# Patient Record
Sex: Male | Born: 1965 | Race: Black or African American | Hispanic: No | Marital: Married | Smoking: Never smoker
Health system: Southern US, Community
[De-identification: ages and names within clinical notes are randomized; demographics above are authoritative.]

## PROBLEM LIST (undated history)

## (undated) DIAGNOSIS — K4090 Unilateral inguinal hernia, without obstruction or gangrene, not specified as recurrent: Secondary | ICD-10-CM

## (undated) DIAGNOSIS — K4091 Unilateral inguinal hernia, without obstruction or gangrene, recurrent: Secondary | ICD-10-CM

## (undated) DIAGNOSIS — R7303 Prediabetes: Secondary | ICD-10-CM

## (undated) DIAGNOSIS — G4730 Sleep apnea, unspecified: Secondary | ICD-10-CM

## (undated) DIAGNOSIS — E782 Mixed hyperlipidemia: Secondary | ICD-10-CM

## (undated) HISTORY — PX: KNEE SURGERY: SHX244

---

## 2012-11-12 ENCOUNTER — Other Ambulatory Visit (HOSPITAL_COMMUNITY): Payer: Self-pay | Admitting: Orthopaedic Surgery

## 2012-11-12 DIAGNOSIS — R52 Pain, unspecified: Secondary | ICD-10-CM

## 2012-11-12 DIAGNOSIS — M2392 Unspecified internal derangement of left knee: Secondary | ICD-10-CM

## 2012-11-13 ENCOUNTER — Ambulatory Visit (HOSPITAL_COMMUNITY): Payer: BC Managed Care – PPO

## 2012-11-19 ENCOUNTER — Ambulatory Visit (HOSPITAL_COMMUNITY)
Admission: RE | Admit: 2012-11-19 | Discharge: 2012-11-19 | Disposition: A | Discharge status: Home / Self Care | Payer: BC Managed Care – PPO | Source: Ambulatory Visit | Attending: Orthopaedic Surgery | Admitting: Orthopaedic Surgery

## 2012-11-19 DIAGNOSIS — M2392 Unspecified internal derangement of left knee: Secondary | ICD-10-CM

## 2012-11-19 DIAGNOSIS — R52 Pain, unspecified: Secondary | ICD-10-CM

## 2012-11-19 DIAGNOSIS — M25569 Pain in unspecified knee: Secondary | ICD-10-CM | POA: Insufficient documentation

## 2012-11-19 DIAGNOSIS — M25469 Effusion, unspecified knee: Secondary | ICD-10-CM | POA: Insufficient documentation

## 2012-11-19 DIAGNOSIS — R937 Abnormal findings on diagnostic imaging of other parts of musculoskeletal system: Secondary | ICD-10-CM | POA: Insufficient documentation

## 2014-11-20 ENCOUNTER — Other Ambulatory Visit: Payer: Self-pay | Admitting: Orthopedic Surgery

## 2014-11-20 DIAGNOSIS — M25562 Pain in left knee: Secondary | ICD-10-CM

## 2014-11-21 ENCOUNTER — Other Ambulatory Visit (HOSPITAL_COMMUNITY): Payer: Self-pay | Admitting: Orthopedic Surgery

## 2014-11-21 DIAGNOSIS — M25562 Pain in left knee: Secondary | ICD-10-CM

## 2014-12-02 ENCOUNTER — Other Ambulatory Visit (HOSPITAL_COMMUNITY): Payer: Self-pay | Admitting: Orthopedic Surgery

## 2014-12-02 ENCOUNTER — Other Ambulatory Visit: Payer: Self-pay | Admitting: Orthopedic Surgery

## 2014-12-02 DIAGNOSIS — M25562 Pain in left knee: Secondary | ICD-10-CM

## 2014-12-03 ENCOUNTER — Ambulatory Visit
Admission: RE | Admit: 2014-12-03 | Discharge: 2014-12-03 | Disposition: A | Discharge status: Home / Self Care | Payer: BLUE CROSS/BLUE SHIELD | Source: Ambulatory Visit | Attending: Orthopedic Surgery | Admitting: Orthopedic Surgery

## 2014-12-03 ENCOUNTER — Other Ambulatory Visit: Payer: Self-pay

## 2014-12-03 DIAGNOSIS — M25562 Pain in left knee: Secondary | ICD-10-CM

## 2014-12-05 ENCOUNTER — Ambulatory Visit (HOSPITAL_COMMUNITY): Payer: Self-pay

## 2016-05-25 ENCOUNTER — Other Ambulatory Visit (INDEPENDENT_AMBULATORY_CARE_PROVIDER_SITE_OTHER): Payer: Self-pay | Admitting: Orthopedic Surgery

## 2016-05-25 NOTE — Telephone Encounter (Signed)
Rx refill

## 2016-06-02 ENCOUNTER — Ambulatory Visit (INDEPENDENT_AMBULATORY_CARE_PROVIDER_SITE_OTHER): Payer: BLUE CROSS/BLUE SHIELD | Admitting: Orthopedic Surgery

## 2016-06-02 ENCOUNTER — Encounter (INDEPENDENT_AMBULATORY_CARE_PROVIDER_SITE_OTHER): Payer: Self-pay | Admitting: Orthopedic Surgery

## 2016-06-02 DIAGNOSIS — M1712 Unilateral primary osteoarthritis, left knee: Secondary | ICD-10-CM | POA: Diagnosis not present

## 2016-06-02 MED ORDER — OXYCODONE HCL 5 MG PO CAPS: 5.0000 mg | ORAL_CAPSULE | Freq: Two times a day (BID) | ORAL | 0 refills | Status: DC

## 2016-06-02 MED ORDER — NABUMETONE 500 MG PO TABS
500.0000 mg | ORAL_TABLET | Freq: Two times a day (BID) | ORAL | 0 refills | Status: DC
Start: 2016-06-02 — End: 2018-01-11

## 2016-06-02 NOTE — Progress Notes (Signed)
   Procedure Note  Patient: Daniel Caldwell             Date of Birth: May 02, 1966           MRN: [id number]             Visit Date: 06/02/2016  Procedures: Visit Diagnoses: Primary osteoarthritis of left knee  Large Joint Inj Date/Time: 06/02/2016 4:37 PM Performed by: Brand Males Authorized by: Meredith Pel   Consent Given by:  Patient Site marked: the procedure site was marked   Timeout: prior to procedure the correct patient, procedure, and site was verified   Indications:  Pain, joint swelling and diagnostic evaluation Location:  Knee Site:  L knee Prep: patient was prepped and draped in usual sterile fashion   Needle Size:  18 G Needle Length:  1.5 inches Approach:  Superolateral Ultrasound Guidance: No   Fluoroscopic Guidance: No   Arthrogram: No   Medications:  48 mg Hylan 48 MG/6ML; 5 mL lidocaine 1 % Patient tolerance:  Patient tolerated the procedure well with no immediate complications

## 2016-06-03 MED ORDER — LIDOCAINE HCL 1 % IJ SOLN
5.0000 mL | INTRAMUSCULAR | Status: AC | PRN
  Administered 2016-06-02: 5 mL

## 2016-06-03 MED ORDER — HYLAN G-F 20 48 MG/6ML IX SOSY
48.0000 mg | PREFILLED_SYRINGE | INTRA_ARTICULAR | Status: AC | PRN
  Administered 2016-06-02: 48 mg via INTRA_ARTICULAR

## 2016-06-06 ENCOUNTER — Telehealth (INDEPENDENT_AMBULATORY_CARE_PROVIDER_SITE_OTHER): Payer: Self-pay | Admitting: Orthopedic Surgery

## 2016-06-06 MED ORDER — OXYCODONE HCL 5 MG PO TABS
5.0000 mg | ORAL_TABLET | Freq: Two times a day (BID) | ORAL | 0 refills | Status: DC
Start: 2016-06-06 — End: 2017-02-22

## 2016-06-06 NOTE — Telephone Encounter (Signed)
Patient is requesting a new script for oxycodone says the pharmacy will not fill his rx because it says capsules and not tablets.  Cb#: [phone]

## 2016-06-06 NOTE — Telephone Encounter (Signed)
Dr Marlou Sa s/w pharm and advised that the capsules can be replaced with tablets- pt will take Rx back to pharm to get filled

## 2016-07-05 ENCOUNTER — Other Ambulatory Visit (INDEPENDENT_AMBULATORY_CARE_PROVIDER_SITE_OTHER): Payer: Self-pay | Admitting: Orthopedic Surgery

## 2016-07-05 NOTE — Telephone Encounter (Signed)
Can you advise on refill? Dean patient

## 2016-09-01 ENCOUNTER — Ambulatory Visit (INDEPENDENT_AMBULATORY_CARE_PROVIDER_SITE_OTHER): Payer: BLUE CROSS/BLUE SHIELD | Admitting: Orthopedic Surgery

## 2016-09-01 ENCOUNTER — Encounter (INDEPENDENT_AMBULATORY_CARE_PROVIDER_SITE_OTHER): Payer: Self-pay

## 2016-09-07 ENCOUNTER — Ambulatory Visit (INDEPENDENT_AMBULATORY_CARE_PROVIDER_SITE_OTHER): Payer: BLUE CROSS/BLUE SHIELD | Admitting: Orthopedic Surgery

## 2016-09-07 ENCOUNTER — Encounter (INDEPENDENT_AMBULATORY_CARE_PROVIDER_SITE_OTHER): Payer: Self-pay | Admitting: Orthopedic Surgery

## 2016-09-07 DIAGNOSIS — M5412 Radiculopathy, cervical region: Secondary | ICD-10-CM | POA: Diagnosis not present

## 2016-09-07 DIAGNOSIS — G8929 Other chronic pain: Secondary | ICD-10-CM | POA: Diagnosis not present

## 2016-09-07 DIAGNOSIS — M25562 Pain in left knee: Secondary | ICD-10-CM | POA: Diagnosis not present

## 2016-09-07 MED ORDER — METHOCARBAMOL 500 MG PO TABS: 500.0000 mg | ORAL_TABLET | Freq: Three times a day (TID) | ORAL | 0 refills | Status: DC | PRN

## 2016-09-07 MED ORDER — PREDNISONE 5 MG (21) PO TBPK
ORAL_TABLET | ORAL | 0 refills | Status: DC
Start: 2016-09-07 — End: 2018-01-08

## 2016-09-07 MED ORDER — METHYLPREDNISOLONE ACETATE 40 MG/ML IJ SUSP
40.0000 mg | INTRAMUSCULAR | Status: AC | PRN
  Administered 2016-09-07: 40 mg via INTRA_ARTICULAR

## 2016-09-07 MED ORDER — LIDOCAINE HCL 1 % IJ SOLN
5.0000 mL | INTRAMUSCULAR | Status: AC | PRN
  Administered 2016-09-07: 5 mL

## 2016-09-07 MED ORDER — BUPIVACAINE HCL 0.25 % IJ SOLN
4.0000 mL | INTRAMUSCULAR | Status: AC | PRN
  Administered 2016-09-07: 4 mL via INTRA_ARTICULAR

## 2016-09-07 NOTE — Progress Notes (Signed)
Office Visit Note   Patient: Daniel Caldwell           Date of Birth: 11-Aug-1965           MRN: [id number] Visit Date: 09/07/2016 Requested by: No referring provider defined for this encounter. PCP: No PCP Per Patient  Subjective: Chief Complaint  Patient presents with  . Left Knee - Follow-up    HPI: Daniel Caldwell is a 51 year old patient with left knee pain.  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  Here for 3 month recheck had an injection 06/02/2016 which helped.  He is also reporting having some right neck trapezial and arm pain following a tire lifting incident at work 6 weeks ago.  He is taking anti-inflammatories and occasional pain medicine but only once every week or 2.  The pain does not wake him from sleep at night.              ROS: All systems reviewed are negative as they relate to the chief complaint within the history of present illness.  Patient denies  fevers or chills.   Assessment & Plan: Visit Diagnoses:  1. Chronic pain of left knee     Plan: Impression is left knee arthritis with no effusion today but some persistent pain.  Injection is performed.  We'll check him back in 3 months for recheck.  Potentially more concerning is what appears to be some cervical radiculopathy on the right-hand side.  No weakness but he does report pain radiating into the forearm.  This happened after strenuous lifting event at work.  Shoulder exam is pretty benign today.  Plan is for Medrol Dosepak muscle relaxers with return office visit if that doesn't help.  Could consider further imaging of the neck first followed by the shoulder if that's negative.  Follow-Up Instructions: Return in about 3 months (around 12/08/2016).   Orders:  No orders of the defined types were placed in this encounter.  Meds ordered this encounter  Medications  . methocarbamol (ROBAXIN) 500 MG tablet    Sig: Take 1 tablet (500 mg total) by mouth every 8 (eight) hours as needed for muscle spasms.   Dispense:  30 tablet    Refill:  0  . predniSONE (STERAPRED UNI-PAK 21 TAB) 5 MG (21) TBPK tablet    Sig: Take as directed in a tapered fashion    Dispense:  21 tablet    Refill:  0      Procedures: Large Joint Inj Date/Time: 09/07/2016 7:32 PM Performed by: Daniel Caldwell Authorized by: Daniel Caldwell   Consent Given by:  Patient Site marked: the procedure site was marked   Timeout: prior to procedure the correct patient, procedure, and site was verified   Indications:  Pain, joint swelling and diagnostic evaluation Location:  Knee Site:  L knee Prep: patient was prepped and draped in usual sterile fashion   Needle Size:  18 G Needle Length:  1.5 inches Approach:  Superolateral Ultrasound Guidance: No   Fluoroscopic Guidance: No   Arthrogram: No   Medications:  5 mL lidocaine 1 %; 4 mL bupivacaine 0.25 %; 40 mg methylPREDNISolone acetate 40 MG/ML Patient tolerance:  Patient tolerated the procedure well with no immediate complications     Clinical Data: No additional findings.  Objective: Vital Signs: There were no vitals taken for this visit.  Physical Exam:   Constitutional: Patient appears well-developed HEENT:  Head: Normocephalic Eyes:EOM are normal Neck: Normal range of motion Cardiovascular: Normal  rate Pulmonary/chest: Effort normal Neurologic: Patient is alert Skin: Skin is warm Psychiatric: Patient has normal mood and affect    Ortho Exam: Orthopedic exam demonstrates pretty good cervical spine range of motion.  Full active and passive range of motion of the right shoulders present with no course grinding or crepitus on physical examination good rotator cuff strength isolated infraspinatus super space and subscap muscle testing.  Negative apprehension relocation testing negative O'Brien's testing negative impingement signs no acromioclavicular joint tenderness to direct palpation.  No definite paresthesias C5 T1.  Motor strength otherwise is  intact in the arms bilaterally.  Left knee exam demonstrates lateral joint line tenderness no effusion 5-7 flexion contracture with intact extensor mechanism and palpable pedal pulses  Specialty Comments:  No specialty comments available.  Imaging: No results found.   PMFS History: There are no active problems to display for this patient.  No past medical history on file.  No family history on file.  No past surgical history on file. Social History   Occupational History  . Not on file.   Social History Main Topics  . Smoking status: Never Smoker  . Smokeless tobacco: Current User    Types: Chew  . Alcohol use Yes  . Drug use: No  . Sexual activity: Not on file

## 2016-09-20 ENCOUNTER — Telehealth (INDEPENDENT_AMBULATORY_CARE_PROVIDER_SITE_OTHER): Payer: Self-pay

## 2016-09-20 NOTE — Telephone Encounter (Signed)
Called patient and left message for him to call back to get scheduled for synvisc one injection.  Per BV online, [employer] will not cover but patients secondary insurance [employer] will cover at 100%

## 2016-12-05 ENCOUNTER — Ambulatory Visit (INDEPENDENT_AMBULATORY_CARE_PROVIDER_SITE_OTHER): Payer: Self-pay

## 2016-12-05 ENCOUNTER — Encounter (INDEPENDENT_AMBULATORY_CARE_PROVIDER_SITE_OTHER): Payer: Self-pay | Admitting: Orthopedic Surgery

## 2016-12-05 ENCOUNTER — Ambulatory Visit (INDEPENDENT_AMBULATORY_CARE_PROVIDER_SITE_OTHER): Payer: BLUE CROSS/BLUE SHIELD | Admitting: Orthopedic Surgery

## 2016-12-05 DIAGNOSIS — M25562 Pain in left knee: Principal | ICD-10-CM

## 2016-12-05 DIAGNOSIS — M542 Cervicalgia: Secondary | ICD-10-CM | POA: Diagnosis not present

## 2016-12-05 DIAGNOSIS — M25511 Pain in right shoulder: Secondary | ICD-10-CM | POA: Diagnosis not present

## 2016-12-05 DIAGNOSIS — G8929 Other chronic pain: Secondary | ICD-10-CM

## 2016-12-06 ENCOUNTER — Ambulatory Visit (INDEPENDENT_AMBULATORY_CARE_PROVIDER_SITE_OTHER): Payer: Self-pay

## 2016-12-06 ENCOUNTER — Telehealth (INDEPENDENT_AMBULATORY_CARE_PROVIDER_SITE_OTHER): Payer: Self-pay | Admitting: *Deleted

## 2016-12-06 NOTE — Telephone Encounter (Signed)
Pt has appt scheduled for MRI CSP at [facility] on Mon June 11 at 3:00pm, pt is to arrive 30 mins early to check in, left message on vm to return my call for appt information

## 2016-12-06 NOTE — Telephone Encounter (Signed)
Patient returned called, advised him of message concerning MRI per [place] S.  Patient was advised to call Forestine Na to reschedule for MRI.  Patient was provided with Phone number.

## 2016-12-07 DIAGNOSIS — M25562 Pain in left knee: Secondary | ICD-10-CM | POA: Diagnosis not present

## 2016-12-07 MED ORDER — BUPIVACAINE HCL 0.25 % IJ SOLN
4.0000 mL | INTRAMUSCULAR | Status: AC | PRN
  Administered 2016-12-07: 4 mL via INTRA_ARTICULAR

## 2016-12-07 MED ORDER — METHYLPREDNISOLONE ACETATE 40 MG/ML IJ SUSP
40.0000 mg | INTRAMUSCULAR | Status: AC | PRN
  Administered 2016-12-07: 40 mg via INTRA_ARTICULAR

## 2016-12-07 MED ORDER — LIDOCAINE HCL 1 % IJ SOLN
5.0000 mL | INTRAMUSCULAR | Status: AC | PRN
  Administered 2016-12-07: 5 mL

## 2016-12-07 NOTE — Progress Notes (Signed)
Office Visit Note   Patient: Daniel Caldwell           Date of Birth: [birth year]           MRN: [id number] Visit Date: 12/05/2016 Requested by: No referring provider defined for this encounter. PCP: Patient, No Pcp Per  Subjective: Chief Complaint  Patient presents with  . Left Knee - Follow-up    HPI: Patient is a 51 year old with left knee pain as well as neck pain and right shoulder pain.  He's having predictable left knee pain which is worse with activity.  Has significant osteochondral defect on that side.  Present shot helped him for 2 months.  In the third month shot begins to wear off.  He also describes neck pain radiating into the right arm down to the fingers.  This is debilitating pain which occurs on a daily basis.  He reports taking oxycodone for this pain.              ROS: All systems reviewed are negative as they relate to the chief complaint within the history of present illness.  Patient denies  fevers or chills.   Assessment & Plan: Visit Diagnoses:  1. Chronic pain of left knee   2. Neck pain   3. Chronic right shoulder pain     Plan: Impression is right sided radiculopathy with normal shoulder exam and radiographs of the shoulder.  He is having fairly classic radicular signs and symptoms affecting the right arm.  This is been going on for several months.  Needs MRI cervical spine to evaluate for right-sided radiculopathy.  He has very heavy lifting for a living.  In regards to the knee we'll inject the cortisone shot into the knee again today.  Last injection prior to the more recent cortisone injection was Synvisc in September.  We will pre-approve him for Synvisc again.  I'll see him back after the MRI scan on his neck  Follow-Up Instructions: No Follow-up on file.   Orders:  Orders Placed This Encounter  Procedures  . XR Shoulder Right  . XR Cervical Spine 2 or 3 views  . MR Cervical Spine w/o contrast   No orders of the defined types were placed in  this encounter.     Procedures: Large Joint Inj Date/Time: 12/07/2016 12:44 PM Performed by: Meredith Pel Authorized by: Meredith Pel   Consent Given by:  Patient Site marked: the procedure site was marked   Timeout: prior to procedure the correct patient, procedure, and site was verified   Indications:  Pain, joint swelling and diagnostic evaluation Location:  Knee Site:  L knee Prep: patient was prepped and draped in usual sterile fashion   Needle Size:  18 G Needle Length:  1.5 inches Approach:  Superolateral Ultrasound Guidance: No   Fluoroscopic Guidance: No   Arthrogram: No   Medications:  5 mL lidocaine 1 %; 4 mL bupivacaine 0.25 %; 40 mg methylPREDNISolone acetate 40 MG/ML Patient tolerance:  Patient tolerated the procedure well with no immediate complications     Clinical Data: No additional findings.  Objective: Vital Signs: There were no vitals taken for this visit.  Physical Exam:   Constitutional: Patient appears well-developed HEENT:  Head: Normocephalic Eyes:EOM are normal Neck: Normal range of motion Cardiovascular: Normal rate Pulmonary/chest: Effort normal Neurologic: Patient is alert Skin: Skin is warm Psychiatric: Patient has normal mood and affect    Ortho Exam: Orthopedic exam demonstrates full active and passive range of  motion of both shoulders with no course grinding or crepitus with active or passive range of motion.  Motor sensory function to the arm and hand is intact but he does report some paresthesias in the C7 distribution right versus left.  Cervical spine range of motion is full but does report reproduction of symptoms with rotation of the head to the right.  Left knee has no effusion but is developing a flexion contracture of approximately 8-9.  Pedal pulses palpable.  Specialty Comments:  No specialty comments available.  Imaging: No results found.   PMFS History: There are no active problems to display for  this patient.  No past medical history on file.  No family history on file.  No past surgical history on file. Social History   Occupational History  . Not on file.   Social History Main Topics  . Smoking status: Never Smoker  . Smokeless tobacco: Current User    Types: Chew  . Alcohol use Yes  . Drug use: No  . Sexual activity: Not on file

## 2016-12-08 ENCOUNTER — Telehealth (INDEPENDENT_AMBULATORY_CARE_PROVIDER_SITE_OTHER): Payer: Self-pay

## 2016-12-08 NOTE — Telephone Encounter (Signed)
Patients primary insurance denied coverage for synvisc injection this time. I have called patient and advised and he would like to know if there is anything else that you can suggest since they denied.

## 2016-12-08 NOTE — Telephone Encounter (Signed)
Can we try mono visc or others>?

## 2016-12-09 NOTE — Telephone Encounter (Signed)
Submitted information for monovisc.

## 2016-12-12 ENCOUNTER — Ambulatory Visit (HOSPITAL_COMMUNITY): Payer: BLUE CROSS/BLUE SHIELD

## 2016-12-14 ENCOUNTER — Ambulatory Visit (HOSPITAL_COMMUNITY)
Admission: RE | Admit: 2016-12-14 | Discharge: 2016-12-14 | Disposition: A | Discharge status: Home / Self Care | Payer: BLUE CROSS/BLUE SHIELD | Source: Ambulatory Visit | Attending: Orthopedic Surgery | Admitting: Orthopedic Surgery

## 2016-12-14 DIAGNOSIS — G8929 Other chronic pain: Secondary | ICD-10-CM

## 2016-12-14 DIAGNOSIS — M542 Cervicalgia: Secondary | ICD-10-CM

## 2016-12-14 DIAGNOSIS — M25511 Pain in right shoulder: Secondary | ICD-10-CM

## 2017-02-22 ENCOUNTER — Encounter (INDEPENDENT_AMBULATORY_CARE_PROVIDER_SITE_OTHER): Payer: Self-pay | Admitting: Orthopedic Surgery

## 2017-02-22 ENCOUNTER — Ambulatory Visit (INDEPENDENT_AMBULATORY_CARE_PROVIDER_SITE_OTHER): Payer: BLUE CROSS/BLUE SHIELD | Admitting: Orthopedic Surgery

## 2017-02-22 DIAGNOSIS — M1712 Unilateral primary osteoarthritis, left knee: Secondary | ICD-10-CM

## 2017-02-22 MED ORDER — BUPIVACAINE HCL 0.25 % IJ SOLN
4.0000 mL | INTRAMUSCULAR | Status: AC | PRN
  Administered 2017-02-22: 4 mL via INTRA_ARTICULAR

## 2017-02-22 MED ORDER — METHYLPREDNISOLONE ACETATE 40 MG/ML IJ SUSP
40.0000 mg | INTRAMUSCULAR | Status: AC | PRN
  Administered 2017-02-22: 40 mg via INTRA_ARTICULAR

## 2017-02-22 MED ORDER — OXYCODONE HCL 5 MG PO TABS: 5.0000 mg | ORAL_TABLET | Freq: Two times a day (BID) | ORAL | 0 refills | Status: DC | PRN

## 2017-02-22 MED ORDER — LIDOCAINE HCL 1 % IJ SOLN
5.0000 mL | INTRAMUSCULAR | Status: AC | PRN
  Administered 2017-02-22: 5 mL

## 2017-02-22 NOTE — Progress Notes (Signed)
Office Visit Note   Patient: Daniel Caldwell           Date of Birth: Aug 06, 1965           MRN: [id number] Visit Date: 02/22/2017 Requested by: No referring provider defined for this encounter. PCP: Patient, No Pcp Per  Subjective: Chief Complaint  Patient presents with  . Left Knee - Follow-up   Daniel Caldwell is a 51 year old patient with left knee pain.  Had left knee cortisone injection almost 3 months ago.  Still is about the same but he feels like his knee is gradually getting worse.  His insurance has decided that they will fail to cover any more gel injections.  These were helping him.  His heart for him to get up after prolonged sitting.  He continues to work.  He does do standing type work.  Describes stiffness and some popping in the knee.  He also describes a progressive flexion contracture. HPI: See above              ROS: All systems reviewed are negative as they relate to the chief complaint within the history of present illness.  Patient denies  fevers or chills.   Assessment & Plan: Visit Diagnoses: No diagnosis found.  Plan: Impression is left knee arthritis primarily lateral sided.  He is getting a progressive flexion contracture on the order of 10-15.  I think it's likely putting him out of intention for partial knee replacement.  He is getting into enough symptoms that he may potentially benefit from total knee replacement.  In his case I would likely consider using press-fit prosthesis based on bone quality and increased longevity of the prosthesis.  He is going to consider his options.  I did do an injection today for pain relief.  Also gave him a knee sleeve because his other one was wearing out.  I'll see him back as needed  Follow-Up Instructions: No Follow-up on file.   Orders:  No orders of the defined types were placed in this encounter.  Meds ordered this encounter  Medications  . oxyCODONE (ROXICODONE) 5 MG immediate release tablet    Sig: Take 1 tablet (5 mg  total) by mouth 2 (two) times daily as needed for severe pain.    Dispense:  30 tablet    Refill:  0      Procedures: Large Joint Inj Date/Time: 02/22/2017 4:38 PM Performed by: Meredith Pel Authorized by: Meredith Pel   Consent Given by:  Patient Site marked: the procedure site was marked   Timeout: prior to procedure the correct patient, procedure, and site was verified   Indications:  Pain, joint swelling and diagnostic evaluation Location:  Knee Site:  L knee Prep: patient was prepped and draped in usual sterile fashion   Needle Size:  18 G Needle Length:  1.5 inches Approach:  Superolateral Ultrasound Guidance: No   Fluoroscopic Guidance: No   Arthrogram: No   Medications:  5 mL lidocaine 1 %; 4 mL bupivacaine 0.25 %; 40 mg methylPREDNISolone acetate 40 MG/ML Patient tolerance:  Patient tolerated the procedure well with no immediate complications     Clinical Data: No additional findings.  Objective: Vital Signs: There were no vitals taken for this visit.  Physical Exam:   Constitutional: Patient appears well-developed HEENT:  Head: Normocephalic Eyes:EOM are normal Neck: Normal range of motion Cardiovascular: Normal rate Pulmonary/chest: Effort normal Neurologic: Patient is alert Skin: Skin is warm Psychiatric: Patient has normal mood and  affect   orthopedic exam demonstrates antalgic gait to the left.  No effusion in the left knee.  Extensor mechanism is intact.  Collateral and cruciate ligaments are stable.  Does have palpable pedal pulses.  Does have lateral sided tenderness to palpation with osteophyte formation noted on the distal lateral femur. Ortho Exam:   Specialty Comments:  No specialty comments available.  Imaging: No results found.   PMFS History: There are no active problems to display for this patient.  No past medical history on file.  No family history on file.  No past surgical history on file. Social History    Occupational History  . Not on file.   Social History Main Topics  . Smoking status: Never Smoker  . Smokeless tobacco: Current User    Types: Chew  . Alcohol use Yes  . Drug use: No  . Sexual activity: Not on file

## 2017-03-09 ENCOUNTER — Emergency Department (HOSPITAL_COMMUNITY): Payer: BLUE CROSS/BLUE SHIELD

## 2017-03-09 ENCOUNTER — Encounter (HOSPITAL_COMMUNITY): Payer: Self-pay | Admitting: Emergency Medicine

## 2017-03-09 ENCOUNTER — Emergency Department (HOSPITAL_COMMUNITY)
Admission: EM | Admit: 2017-03-09 | Discharge: 2017-03-09 | Disposition: A | Discharge status: Home / Self Care | Payer: BLUE CROSS/BLUE SHIELD | Attending: Emergency Medicine | Admitting: Emergency Medicine

## 2017-03-09 DIAGNOSIS — K649 Unspecified hemorrhoids: Secondary | ICD-10-CM | POA: Insufficient documentation

## 2017-03-09 DIAGNOSIS — K4090 Unilateral inguinal hernia, without obstruction or gangrene, not specified as recurrent: Secondary | ICD-10-CM | POA: Diagnosis not present

## 2017-03-09 DIAGNOSIS — Z79899 Other long term (current) drug therapy: Secondary | ICD-10-CM | POA: Diagnosis not present

## 2017-03-09 DIAGNOSIS — R1084 Generalized abdominal pain: Secondary | ICD-10-CM | POA: Diagnosis present

## 2017-03-09 DIAGNOSIS — F17220 Nicotine dependence, chewing tobacco, uncomplicated: Secondary | ICD-10-CM | POA: Diagnosis not present

## 2017-03-09 DIAGNOSIS — Z791 Long term (current) use of non-steroidal anti-inflammatories (NSAID): Secondary | ICD-10-CM | POA: Diagnosis not present

## 2017-03-09 LAB — CBC WITH DIFFERENTIAL/PLATELET
BASOS ABS: 0 10*3/uL (ref 0.0–0.1)
BASOS PCT: 1 %
Eosinophils Absolute: 0.2 10*3/uL (ref 0.0–0.7)
Eosinophils Relative: 3 %
HCT: 44.3 % (ref 39.0–52.0)
HEMOGLOBIN: 14.3 g/dL (ref 13.0–17.0)
LYMPHS PCT: 21 %
Lymphs Abs: 1.3 10*3/uL (ref 0.7–4.0)
MCH: 27.8 pg (ref 26.0–34.0)
MCHC: 32.3 g/dL (ref 30.0–36.0)
MCV: 86 fL (ref 78.0–100.0)
MONOS PCT: 12 %
Monocytes Absolute: 0.7 10*3/uL (ref 0.1–1.0)
NEUTROS ABS: 3.8 10*3/uL (ref 1.7–7.7)
NEUTROS PCT: 63 %
Platelets: 215 10*3/uL (ref 150–400)
RBC: 5.15 MIL/uL (ref 4.22–5.81)
RDW: 13.8 % (ref 11.5–15.5)
WBC: 5.9 10*3/uL (ref 4.0–10.5)

## 2017-03-09 LAB — BASIC METABOLIC PANEL
ANION GAP: 8 (ref 5–15)
BUN: 15 mg/dL (ref 6–20)
CALCIUM: 9.4 mg/dL (ref 8.9–10.3)
CHLORIDE: 101 mmol/L (ref 101–111)
CO2: 32 mmol/L (ref 22–32)
Creatinine, Ser: 1.14 mg/dL (ref 0.61–1.24)
GLUCOSE: 100 mg/dL — AB (ref 65–99)
Potassium: 4.2 mmol/L (ref 3.5–5.1)
Sodium: 141 mmol/L (ref 135–145)

## 2017-03-09 NOTE — ED Provider Notes (Signed)
[facility] DEPT Provider Note   CSN: [id number] Arrival date & time: 03/09/17  1806     History   Chief Complaint Chief Complaint  Patient presents with  . Hemorrhoids    HPI Daniel Caldwell is a 51 y.o. male.   Presents for evaluation of right-sided groin hernia.  This was noticed today, when he was being evaluated for hemorrhoids at an urgent care.  After there was concerned about incarceration, so sent him here.  He has been constipated recently, straining to have stools.  Today he noticed some bleeding from his hemorrhoids, so went to get them checked.  He denies fever, chills, nausea, vomiting, weakness or dizziness.  He is known that he has had a recurrent hernia, for several years.  There are no other known modifying factors  HPI  History reviewed. No pertinent past medical history.  There are no active problems to display for this patient.   Past Surgical History:  Procedure Laterality Date  . KNEE SURGERY         Home Medications    Prior to Admission medications   Medication Sig Start Date End Date Taking? Authorizing Provider  meloxicam (MOBIC) 15 MG tablet TAKE 1 TABLET BY MOUTH EVERY DAY WITH FOOD AS DIRECTED 07/05/16   Marybelle Killings, MD  methocarbamol (ROBAXIN) 500 MG tablet Take 1 tablet (500 mg total) by mouth every 8 (eight) hours as needed for muscle spasms. 09/07/16   Meredith Pel, MD  nabumetone (RELAFEN) 500 MG tablet Take 1 tablet (500 mg total) by mouth 2 (two) times daily. 06/02/16   Meredith Pel, MD  oxyCODONE (ROXICODONE) 5 MG immediate release tablet Take 1 tablet (5 mg total) by mouth 2 (two) times daily as needed for severe pain. 02/22/17   Meredith Pel, MD  predniSONE (STERAPRED UNI-PAK 21 TAB) 5 MG (21) TBPK tablet Take as directed in a tapered fashion 09/07/16   Marlou Sa Tonna Corner, MD    Family History History reviewed. No pertinent family history.  Social History Social History  Substance Use Topics  . Smoking  status: Never Smoker  . Smokeless tobacco: Current User    Types: Chew  . Alcohol use Yes     Comment: occ     Allergies   Penicillins   Review of Systems Review of Systems  All other systems reviewed and are negative.    Physical Exam Updated Vital Signs BP 122/72   Pulse 85   Temp 99 F (37.2 C) (Oral)   Resp 15   Ht 5\' 10"  (1.778 m)   Wt 77.1 kg (170 lb)   SpO2 97%   BMI 24.39 kg/m   Physical Exam  Constitutional: He is oriented to person, place, and time. He appears well-developed and well-nourished.  HENT:  Head: Normocephalic and atraumatic.  Right Ear: External ear normal.  Left Ear: External ear normal.  Eyes: Pupils are equal, round, and reactive to light. Conjunctivae and EOM are normal.  Neck: Normal range of motion and phonation normal. Neck supple.  Cardiovascular: Normal rate, regular rhythm and normal heart sounds.   Pulmonary/Chest: Effort normal and breath sounds normal. He exhibits no bony tenderness.  Abdominal: Soft. There is no tenderness.  Bilateral reducible groin hernias without tenderness, or overlying skin changes.  Genitourinary:  Genitourinary Comments: Normal penis, scrotum and scrotal contents  Musculoskeletal: Normal range of motion.  Neurological: He is alert and oriented to person, place, and time. No cranial nerve deficit or sensory deficit. He  exhibits normal muscle tone. Coordination normal.  Skin: Skin is warm, dry and intact.  Psychiatric: He has a normal mood and affect. His behavior is normal. Judgment and thought content normal.  Nursing note and vitals reviewed.    ED Treatments / Results  Labs (all labs ordered are listed, but only abnormal results are displayed) Labs Reviewed  BASIC METABOLIC PANEL - Abnormal; Notable for the following:       Result Value   Glucose, Bld 100 (*)    All other components within normal limits  CBC WITH DIFFERENTIAL/PLATELET    EKG  EKG Interpretation None        Radiology Ct Abdomen Pelvis Wo Contrast  Result Date: 03/09/2017 CLINICAL DATA:  Patient reports was seen at urgent care this morning for pain and bleeding related to hemorrhoids. Patient reports during exam doctor reported had right inguinal hernia. Patient reports was sent over to rule out strangulation. Patient denies any urinary symptoms or groin pain. EXAM: CT ABDOMEN AND PELVIS WITHOUT CONTRAST TECHNIQUE: Multidetector CT imaging of the abdomen and pelvis was performed following the standard protocol without IV contrast. COMPARISON:  None. FINDINGS: Lower chest: No acute abnormality. Hepatobiliary: No focal liver abnormality is seen. No gallstones, gallbladder wall thickening, or biliary dilatation. Pancreas: Unremarkable. No pancreatic ductal dilatation or surrounding inflammatory changes. Spleen: Normal in size without focal abnormality. Adrenals/Urinary Tract: No adrenal gland nodules. 3 mm stone in the midportion right kidney. No hydronephrosis or hydroureter. Scattered subcentimeter parenchymal and parapelvic cysts in the kidneys. No ureteral stones. Bladder is decompressed. Stomach/Bowel: Small esophageal hiatal hernia. Small diverticulum arising from the posterior gastric cardia. Stomach is not abnormally distended. Small bowel are mostly decompressed. Colon is not abnormally distended. Stool throughout the colon. There are bilateral inguinal hernias. The left inguinal hernia contains a portion of the sigmoid colon extending to the descending/ sigmoid junction. The right inguinal hernia contains a loop of small bowel. No evidence of proximal obstruction. Appendix is normal. Vascular/Lymphatic: No significant vascular findings are present. No enlarged abdominal or pelvic lymph nodes. Reproductive: Prostate is unremarkable. Other: No free air or free fluid in the abdomen. 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 No destructive bone lesions. IMPRESSION: 1. Right  inguinal hernia containing small bowel. No proximal obstruction. 2. Left inguinal hernia containing sigmoid colon. No proximal obstruction. 3. Nonobstructing stone in the right kidney. 4. Small esophageal hiatal hernia. Small diverticulum arising from the stomach. Electronically Signed   By: Lucienne Capers M.D.   On: 03/09/2017 21:20    Procedures Procedures (including critical care time)  Medications Ordered in ED Medications - No data to display   Initial Impression / Assessment and Plan / ED Course  I have reviewed the triage vital signs and the nursing notes.  Pertinent labs & imaging results that were available during my care of the patient were reviewed by me and considered in my medical decision making (see chart for details).      Patient Vitals for the past 24 hrs:  BP Temp Temp src Pulse Resp SpO2 Height Weight  03/09/17 2202 122/72 - - 85 15 97 % - -  03/09/17 2201 122/72 - - 85 15 - - -  03/09/17 2130 122/72 - - (!) 59 - 97 % - -  03/09/17 2121 115/66 - - 82 17 98 % - -  03/09/17 1816 (!) 145/87 99 F (37.2 C) Oral 63 16 99 % 5\' 10"  (1.778 m) 77.1 kg (170 lb)  At discharge- reevaluation with update and discussion. After initial assessment and treatment, an updated evaluation reveals no change in clinical status.  Findings discussed with the patient and all questions were answered. Shareese Macha L      Final Clinical Impressions(s) / ED Diagnoses   Final diagnoses:  Left groin hernia  Right groin hernia   Bilateral groin hernias without signs for incarceration, or intra-abdominal compromise.  Apparent hemorrhoids described by patient, likely related to constipation.  Patient was instructed on the importance of normal stooling to prevent complications from hernia as well as hemorrhoids.  He has already been treated for the hemorrhoids previously today.  Nursing Notes Reviewed/ Care Coordinated Applicable Imaging Reviewed Interpretation of Laboratory Data  incorporated into ED treatment  The patient appears reasonably screened and/or stabilized for discharge and I doubt any other medical condition or other [facility] requiring further screening, evaluation, or treatment in the ED at this time prior to discharge.  Plan: Home Medications-continue usual; Home Treatments-work on stool softening with fiber, and stool softeners as needed; return here if the recommended treatment, does not improve the symptoms; Recommended follow up-general surgery follow-up to consider corrective surgery, for hernias   New Prescriptions Discharge Medication List as of 03/09/2017  9:54 PM       Daleen Bo, MD 03/10/17 1130

## 2017-03-09 NOTE — Discharge Instructions (Signed)
Avoid straining to have a bowel movement.  Try the fiber that was recommended earlier today.  If that does not work take MiraLAX twice a day until having daily soft stools, then once a day for 1 month.  For the hemorrhoids, soak in a warm tub of water twice a day, and use the medication recommended earlier

## 2017-03-09 NOTE — ED Notes (Signed)
Verbal order for work note til Monday for pt

## 2017-03-09 NOTE — ED Triage Notes (Signed)
Pt reports was seen at urgent care this am for pain and bleeding related to hemorrhoids. Pt reports during exam doctor reported had right inguinal hernia. Pt reports was sent over to rule out strangulation. Pt denies any urinary symptoms or groin pain.

## 2017-06-07 ENCOUNTER — Ambulatory Visit (INDEPENDENT_AMBULATORY_CARE_PROVIDER_SITE_OTHER): Payer: BLUE CROSS/BLUE SHIELD | Admitting: Orthopedic Surgery

## 2017-06-14 ENCOUNTER — Encounter (INDEPENDENT_AMBULATORY_CARE_PROVIDER_SITE_OTHER): Payer: Self-pay | Admitting: Orthopedic Surgery

## 2017-06-14 ENCOUNTER — Ambulatory Visit (INDEPENDENT_AMBULATORY_CARE_PROVIDER_SITE_OTHER): Payer: BLUE CROSS/BLUE SHIELD | Admitting: Orthopedic Surgery

## 2017-06-14 DIAGNOSIS — M1712 Unilateral primary osteoarthritis, left knee: Secondary | ICD-10-CM

## 2017-06-14 MED ORDER — OXYCODONE HCL 5 MG PO TABS: ORAL_TABLET | ORAL | 0 refills | Status: DC

## 2017-06-15 ENCOUNTER — Encounter (INDEPENDENT_AMBULATORY_CARE_PROVIDER_SITE_OTHER): Payer: Self-pay | Admitting: Orthopedic Surgery

## 2017-06-15 NOTE — Progress Notes (Signed)
   Office Visit Note   Patient: Daniel Caldwell           Date of Birth: [birth year]           MRN: [id number] Visit Date: 06/14/2017 Requested by: No referring provider defined for this encounter. PCP: Patient, No Pcp Per  Subjective: Chief Complaint  Patient presents with  . Left Knee - Pain    HPI: Daniel Caldwell is a 51 year old patient with known left knee arthritis.  He has had injections this year in March June and August.  He works in a physically demanding job which requires standing and walking.  He takes oxycodone but only about 2 tablets/week.  He has continuing and persistent pain in the left knee but no mechanical symptoms.  He does well with gel injections but his insurance does not cover them anymore.  We have tried to obtain samples but has not been successful.              ROS: All systems reviewed are negative as they relate to the chief complaint within the history of present illness.  Patient denies  fevers or chills.   Assessment & Plan: Visit Diagnoses: No diagnosis found.  Plan: Impression is left knee progressive arthritis but without significantly progressive deformity and fairly minimal effusion today.  He is having lateral sided pain.  Plan is injection today.  It has been 4 months since his last injection and we really want to try to keep this on a 27-month schedule.  I will see him back as needed  Follow-Up Instructions: Return if symptoms worsen or fail to improve.   Orders:  No orders of the defined types were placed in this encounter.  Meds ordered this encounter  Medications  . oxyCODONE (ROXICODONE) 5 MG immediate release tablet    Sig: 1 po q d prn    Dispense:  40 tablet    Refill:  0      Procedures: No procedures performed   Clinical Data: No additional findings.  Objective: Vital Signs: There were no vitals taken for this visit.  Physical Exam:   Constitutional: Patient appears well-developed HEENT:  Head: Normocephalic Eyes:EOM are  normal Neck: Normal range of motion Cardiovascular: Normal rate Pulmonary/chest: Effort normal Neurologic: Patient is alert Skin: Skin is warm Psychiatric: Patient has normal mood and affect    Ortho Exam: Orthopedic exam demonstrates antalgic gait to the left no effusion excellent range of motion with stable collateral cruciate ligaments palpable pedal pulses no masses lymphadenopathy or skin changes noted in the left knee region.  He has lateral greater than medial joint line tenderness  Specialty Comments:  No specialty comments available.  Imaging: No results found.   PMFS History: There are no active problems to display for this patient.  History reviewed. No pertinent past medical history.  History reviewed. No pertinent family history.  Past Surgical History:  Procedure Laterality Date  . KNEE SURGERY     Social History   Occupational History  . Not on file  Tobacco Use  . Smoking status: Never Smoker  . Smokeless tobacco: Current User    Types: Chew  Substance and Sexual Activity  . Alcohol use: Yes    Comment: occ  . Drug use: No  . Sexual activity: Not on file

## 2017-07-05 ENCOUNTER — Telehealth (INDEPENDENT_AMBULATORY_CARE_PROVIDER_SITE_OTHER): Payer: Self-pay | Admitting: Orthopedic Surgery

## 2017-07-05 NOTE — Telephone Encounter (Signed)
Patient called saying that he is needing a prior authorization for his oxycodone. He gave me a [phone] to call back.

## 2017-07-07 ENCOUNTER — Telehealth (INDEPENDENT_AMBULATORY_CARE_PROVIDER_SITE_OTHER): Payer: Self-pay

## 2017-07-07 NOTE — Telephone Encounter (Signed)
Patient called checking the status for PA for Oxycodone.  Cb# is [phone].  Please advise.  Thank you.

## 2017-07-07 NOTE — Telephone Encounter (Signed)
IC s/w patient. Insurance will only cover 7 days worth of narcotics. He is not out and did receive a 7 days supply. He will call back when/if he needs a refill.

## 2017-07-07 NOTE — Telephone Encounter (Signed)
See previous message

## 2017-09-19 ENCOUNTER — Telehealth (INDEPENDENT_AMBULATORY_CARE_PROVIDER_SITE_OTHER): Payer: Self-pay | Admitting: Orthopedic Surgery

## 2017-09-19 NOTE — Telephone Encounter (Signed)
Patient called asking if he could get another Cortizone injection in his knee, states he's having a lot of trouble walking. CB # [phone]

## 2017-09-19 NOTE — Telephone Encounter (Signed)
Would you be willing to repeat injection? Last done 02/2017.

## 2017-09-19 NOTE — Telephone Encounter (Signed)
y

## 2017-09-20 ENCOUNTER — Telehealth (INDEPENDENT_AMBULATORY_CARE_PROVIDER_SITE_OTHER): Payer: Self-pay | Admitting: Orthopedic Surgery

## 2017-09-20 MED ORDER — OXYCODONE HCL 5 MG PO TABS: ORAL_TABLET | ORAL | 0 refills | Status: DC

## 2017-09-20 NOTE — Telephone Encounter (Signed)
IC no answer. LMVM advising ok to sched appt for injection.

## 2017-09-20 NOTE — Telephone Encounter (Signed)
Patient called requesting a refill of Oxycodone.  Please cll patient to advise

## 2017-09-20 NOTE — Telephone Encounter (Signed)
Ok to refill 

## 2017-09-20 NOTE — Telephone Encounter (Signed)
y

## 2017-09-20 NOTE — Telephone Encounter (Signed)
IC advised could pick up at front desk.  

## 2017-10-18 ENCOUNTER — Ambulatory Visit (INDEPENDENT_AMBULATORY_CARE_PROVIDER_SITE_OTHER): Payer: BLUE CROSS/BLUE SHIELD | Admitting: Orthopedic Surgery

## 2017-10-19 ENCOUNTER — Ambulatory Visit (INDEPENDENT_AMBULATORY_CARE_PROVIDER_SITE_OTHER): Payer: BLUE CROSS/BLUE SHIELD | Admitting: Orthopedic Surgery

## 2017-10-19 ENCOUNTER — Encounter (INDEPENDENT_AMBULATORY_CARE_PROVIDER_SITE_OTHER): Payer: Self-pay | Admitting: Orthopedic Surgery

## 2017-10-19 DIAGNOSIS — M1712 Unilateral primary osteoarthritis, left knee: Secondary | ICD-10-CM

## 2017-10-21 ENCOUNTER — Encounter (INDEPENDENT_AMBULATORY_CARE_PROVIDER_SITE_OTHER): Payer: Self-pay | Admitting: Orthopedic Surgery

## 2017-10-21 DIAGNOSIS — M1712 Unilateral primary osteoarthritis, left knee: Secondary | ICD-10-CM | POA: Diagnosis not present

## 2017-10-21 MED ORDER — BUPIVACAINE HCL 0.25 % IJ SOLN
4.0000 mL | INTRAMUSCULAR | Status: AC | PRN
  Administered 2017-10-21: 4 mL via INTRA_ARTICULAR

## 2017-10-21 MED ORDER — METHYLPREDNISOLONE ACETATE 40 MG/ML IJ SUSP
40.0000 mg | INTRAMUSCULAR | Status: AC | PRN
  Administered 2017-10-21: 40 mg via INTRA_ARTICULAR

## 2017-10-21 MED ORDER — LIDOCAINE HCL 1 % IJ SOLN
5.0000 mL | INTRAMUSCULAR | Status: AC | PRN
  Administered 2017-10-21: 5 mL

## 2017-10-21 NOTE — Progress Notes (Signed)
Office Visit Note   Patient: Daniel Caldwell           Date of Birth: 02-Dec-1965           MRN: [id number] Visit Date: 10/19/2017 Requested by: No referring provider defined for this encounter. PCP: Patient, No Pcp Per  Subjective: Chief Complaint  Patient presents with  . Left Knee - Pain    HPI: Daniel Caldwell is a patient with left knee pain and arthritis.  Reports continued pain in the left knee.  Describes global pain but most of it is on the lateral side.  He is still doing very physical type of work.  Would like to have cortisone injection today.  He is using a knee sleeve.  Denies any mechanical symptoms just reports pain              ROS: All systems reviewed are negative as they relate to the chief complaint within the history of present illness.  Patient denies  fevers or chills.   Assessment & Plan: Visit Diagnoses:  1. Unilateral primary osteoarthritis, left knee     Plan: Impression is left knee pain and arthritis primarily in the lateral compartment but with global symptoms.  Patient is developing a flexion contracture.  Plan is cortisone injection today.  He would like to have knee replacement in July.  I think that would be acceptable in terms of time elapsed from the cortisone shot.  Risks and benefits are discussed with the patient including but not limited to infection nerve vessel damage implant longevity as well as knee stiffness and incomplete pain relief.  Patient understands the risks and benefits.  All questions answered  Follow-Up Instructions: No follow-ups on file.   Orders:  No orders of the defined types were placed in this encounter.  No orders of the defined types were placed in this encounter.     Procedures: Large Joint Inj: L knee on 10/21/2017 8:36 AM Indications: diagnostic evaluation, joint swelling and pain Details: 18 G 1.5 in needle, superolateral approach  Arthrogram: No  Medications: 5 mL lidocaine 1 %; 40 mg methylPREDNISolone acetate 40  MG/ML; 4 mL bupivacaine 0.25 % Outcome: tolerated well, no immediate complications Procedure, treatment alternatives, risks and benefits explained, specific risks discussed. Consent was given by the patient. Immediately prior to procedure a time out was called to verify the correct patient, procedure, equipment, support staff and site/side marked as required. Patient was prepped and draped in the usual sterile fashion.       Clinical Data: No additional findings.  Objective: Vital Signs: There were no vitals taken for this visit.  Physical Exam:   Constitutional: Patient appears well-developed HEENT:  Head: Normocephalic Eyes:EOM are normal Neck: Normal range of motion Cardiovascular: Normal rate Pulmonary/chest: Effort normal Neurologic: Patient is alert Skin: Skin is warm Psychiatric: Patient has normal mood and affect    Ortho Exam: Orthopedic exam demonstrates antalgic gait to the left slightly.  Patient has about a 10 degree flexion contracture on the left.  Extensor mechanism is intact.  Pedal pulses palpable.  Patient has lateral greater than medial joint line tenderness.  Flexion is past 90.  Mild effusion is present.  Patellofemoral crepitus is present.  Specialty Comments:  No specialty comments available.  Imaging: No results found.   PMFS History: There are no active problems to display for this patient.  History reviewed. No pertinent past medical history.  History reviewed. No pertinent family history.  Past Surgical History:  Procedure  Laterality Date  . KNEE SURGERY     Social History   Occupational History  . Not on file  Tobacco Use  . Smoking status: Never Smoker  . Smokeless tobacco: Current User    Types: Chew  Substance and Sexual Activity  . Alcohol use: Yes    Comment: occ  . Drug use: No  . Sexual activity: Not on file

## 2017-12-28 ENCOUNTER — Other Ambulatory Visit: Payer: Self-pay | Admitting: General Surgery

## 2017-12-29 ENCOUNTER — Other Ambulatory Visit (INDEPENDENT_AMBULATORY_CARE_PROVIDER_SITE_OTHER): Payer: Self-pay | Admitting: Orthopedic Surgery

## 2017-12-29 DIAGNOSIS — M1712 Unilateral primary osteoarthritis, left knee: Secondary | ICD-10-CM

## 2018-01-09 ENCOUNTER — Telehealth (INDEPENDENT_AMBULATORY_CARE_PROVIDER_SITE_OTHER): Payer: Self-pay | Admitting: Orthopedic Surgery

## 2018-01-09 NOTE — Telephone Encounter (Signed)
Patient called to cancel his LEFT TOTAL KNEE ARTHROPLASTY that was scheduled on July 23rd.  He states that he is scheduled for surgery on July 22nd to repair two hernias.  He has my name and direct number to call and reschedule when he has recovered from his hernia surgery.

## 2018-01-09 NOTE — Telephone Encounter (Signed)
fyi

## 2018-01-09 NOTE — Telephone Encounter (Signed)
thx

## 2018-01-10 ENCOUNTER — Inpatient Hospital Stay (HOSPITAL_COMMUNITY): Admission: RE | Admit: 2018-01-10 | Payer: BLUE CROSS/BLUE SHIELD | Source: Ambulatory Visit

## 2018-01-11 ENCOUNTER — Encounter (HOSPITAL_BASED_OUTPATIENT_CLINIC_OR_DEPARTMENT_OTHER): Payer: Self-pay | Admitting: *Deleted

## 2018-01-18 ENCOUNTER — Encounter (HOSPITAL_BASED_OUTPATIENT_CLINIC_OR_DEPARTMENT_OTHER)
Admission: RE | Admit: 2018-01-18 | Discharge: 2018-01-18 | Disposition: A | Discharge status: Home / Self Care | Payer: BLUE CROSS/BLUE SHIELD | Source: Ambulatory Visit | Attending: General Surgery | Admitting: General Surgery

## 2018-01-18 DIAGNOSIS — Z01812 Encounter for preprocedural laboratory examination: Secondary | ICD-10-CM | POA: Diagnosis present

## 2018-01-18 LAB — COMPREHENSIVE METABOLIC PANEL
ALK PHOS: 59 U/L (ref 38–126)
ALT: 17 U/L (ref 0–44)
ANION GAP: 7 (ref 5–15)
AST: 17 U/L (ref 15–41)
Albumin: 4.1 g/dL (ref 3.5–5.0)
BUN: 12 mg/dL (ref 6–20)
CALCIUM: 9.3 mg/dL (ref 8.9–10.3)
CO2: 32 mmol/L (ref 22–32)
Chloride: 104 mmol/L (ref 98–111)
Creatinine, Ser: 1.15 mg/dL (ref 0.61–1.24)
Glucose, Bld: 96 mg/dL (ref 70–99)
Potassium: 4.2 mmol/L (ref 3.5–5.1)
Sodium: 143 mmol/L (ref 135–145)
Total Bilirubin: 0.8 mg/dL (ref 0.3–1.2)
Total Protein: 7.3 g/dL (ref 6.5–8.1)

## 2018-01-18 LAB — CBC WITH DIFFERENTIAL/PLATELET
ABS IMMATURE GRANULOCYTES: 0 10*3/uL (ref 0.0–0.1)
Basophils Absolute: 0 10*3/uL (ref 0.0–0.1)
Basophils Relative: 1 %
EOS PCT: 1 %
Eosinophils Absolute: 0 10*3/uL (ref 0.0–0.7)
HCT: 43.5 % (ref 39.0–52.0)
Hemoglobin: 13.6 g/dL (ref 13.0–17.0)
Immature Granulocytes: 0 %
Lymphocytes Relative: 39 %
Lymphs Abs: 1.4 10*3/uL (ref 0.7–4.0)
MCH: 27.4 pg (ref 26.0–34.0)
MCHC: 31.3 g/dL (ref 30.0–36.0)
MCV: 87.7 fL (ref 78.0–100.0)
MONO ABS: 0.4 10*3/uL (ref 0.1–1.0)
MONOS PCT: 12 %
NEUTROS ABS: 1.7 10*3/uL (ref 1.7–7.7)
Neutrophils Relative %: 47 %
PLATELETS: 215 10*3/uL (ref 150–400)
RBC: 4.96 MIL/uL (ref 4.22–5.81)
RDW: 13.2 % (ref 11.5–15.5)
WBC: 3.5 10*3/uL — ABNORMAL LOW (ref 4.0–10.5)

## 2018-01-18 NOTE — Progress Notes (Signed)
Ensure pre surgery drink given with instructions, pt verbalized understanding. 

## 2018-01-21 NOTE — H&P (Signed)
Nile Dear Location: [facility] Patient #: [id number] DOB: May 28, 1966 Married / Language: English / Race: Black or African American Male        History of Present Illness        This is a 52 year old man, referred by Dr. Eber Hong in [place] for evaluation of bilateral inguinal hernias. Dr. Alphonzo Severance is his orthopedic surgeon planning knee replacement.     The patient has had a right inguinal hernia for many years. Getting a little bit larger. Has some aching discomfort at work now. When Dr. Brynda Greathouse saw him he noted a very large indirect right inguinal hernia and a smaller left inguinal hernia. Patient was to have these repaired at some point. He is planning left total knee replacement by Dr. Marlou Sa.Marland Kitchen He requested that we repair the hernias first.       Past history significant for possible sleep apnea. Sleep study pending. No prior hernia surgery. Degenerative joint disease. Surgery for left medial collateral ligament. Takes oxycodone chronically for knee pain Family history reveals mother died of heart disease following a fall. Father living and well. May have diabetes Social history reveals married with 3 children. Lives in [place]. Works for [employer] and does lots of lifting. Denies tobacco. Drinks about 3 beers a week.      Exam revealed a very large indirect right inguinal hernia that I had to forcibly reduce and was painful. Smaller but obvious left inguinal hernia After he recovers from his knee surgery he would like to have the hernias repaired Because the right inguinal hernia is so large and partially incarcerated at the regard to have to do this as as an open repair. Increased risk of recurrence with laparoscopic repair     Electively, he will be scheduled for open repair of bilateral inguinal hernias with mesh. I think I will keep him overnight in the hospital for observation for pain control and urinary retention risk Do bilateral  TAPP blocks and use EXPAREL.     I discussed the indications, details, techniques, and risk of the surgery with him in detail. He is aware the risk of bleeding, infection, recurrence of the hernia, nerve damage with chronic pain, injury to the testicle or other adjacent organs, and other unforeseen problems. He understands all these issues well. All his questions were answered. He agrees with this plan.     I told him he could work but that heavy lifting will aggravate the pain preop. I told him no sports or lifting more than 20 pounds for 5 weeks postop   Past Surgical History  Knee Surgery  Left.  Diagnostic Studies History  Colonoscopy  never  Allergies  Penicillin G Benzathine & Proc *PENICILLINS*   Medication History  OxyCODONE HCl (5MG  Tablet, Oral) Active. Multi Vitamin (Oral) Active.  Social History Alcohol use  Occasional alcohol use. Caffeine use  Carbonated beverages, Coffee, Tea. No drug use  Tobacco use  Never smoker.  Family History  Cancer  Brother. Heart Disease  Mother. Heart disease in male family member before age 53   Other Problems  Arthritis  Hemorrhoids  Inguinal Hernia     Review of Systems  General Not Present- Appetite Loss, Chills, Fatigue, Fever, Night Sweats, Weight Gain and Weight Loss. Skin Not Present- Change in Wart/Mole, Dryness, Hives, Jaundice, New Lesions, Non-Healing Wounds, Rash and Ulcer. HEENT Not Present- Earache, Hearing Loss, Hoarseness, Nose Bleed, Oral Ulcers, Ringing in the Ears, Seasonal Allergies, Sinus Pain, Sore Throat, Visual  Disturbances, Wears glasses/contact lenses and Yellow Eyes. Respiratory Not Present- Bloody sputum, Chronic Cough, Difficulty Breathing, Snoring and Wheezing. Breast Not Present- Breast Mass, Breast Pain, Nipple Discharge and Skin Changes. Cardiovascular Not Present- Chest Pain, Difficulty Breathing Lying Down, Leg Cramps, Palpitations, Rapid Heart Rate, Shortness of Breath and  Swelling of Extremities. Gastrointestinal Not Present- Abdominal Pain, Bloating, Bloody Stool, Change in Bowel Habits, Chronic diarrhea, Constipation, Difficulty Swallowing, Excessive gas, Gets full quickly at meals, Hemorrhoids, Indigestion, Nausea, Rectal Pain and Vomiting. Male Genitourinary Not Present- Blood in Urine, Change in Urinary Stream, Frequency, Impotence, Nocturia, Painful Urination, Urgency and Urine Leakage. Musculoskeletal Present- Joint Pain. Not Present- Back Pain, Joint Stiffness, Muscle Pain, Muscle Weakness and Swelling of Extremities. Neurological Not Present- Decreased Memory, Fainting, Headaches, Numbness, Seizures, Tingling, Tremor, Trouble walking and Weakness. Psychiatric Not Present- Anxiety, Bipolar, Change in Sleep Pattern, Depression, Fearful and Frequent crying. Endocrine Not Present- Cold Intolerance, Excessive Hunger, Hair Changes, Heat Intolerance, Hot flashes and New Diabetes. Hematology Not Present- Blood Thinners, Easy Bruising, Excessive bleeding, Gland problems, HIV and Persistent Infections.  Vitals  Weight: 165.25 lb Height: 69in Body Surface Area: 1.9 m Body Mass Index: 24.4 kg/m  Temp.: 97.64F  Pulse: 82 (Regular)  Resp.: 18 (Unlabored)  P.OX: 98% (Room air)    Physical Exam  General Mental Status-Alert. General Appearance-Consistent with stated age. Hydration-Well hydrated. Voice-Normal.  Head and Neck Head-normocephalic, atraumatic with no lesions or palpable masses. Trachea-midline. Thyroid Gland Characteristics - normal size and consistency.  Eye Eyeball - Bilateral-Extraocular movements intact. Sclera/Conjunctiva - Bilateral-No scleral icterus.   Chest and lung exam reveals -quiet, even and easy respiratory effort with no use of accessory muscles and on auscultation, normal breath sounds, no adventitious sounds and normal vocal resonance. Inspection Chest Wall - Normal. Back -  normal.  Cardiovascular examination reveals -normal heart sounds, regular rate and rhythm with no murmurs and normal pedal pulses bilaterally.  Abdomen Inspection Inspection of the abdomen reveals - No Hernias. Skin - Scar - no surgical scars. Palpation/Percussion Palpation and Percussion of the abdomen reveal - Soft, Non Tender, No Rebound tenderness, No Rigidity (guarding) and No hepatosplenomegaly. Auscultation Auscultation of the abdomen reveals - Bowel sounds normal.  Male Genitourinary Note: Examined supine and standing. Large indirect right internal hernia extending into the scrotum. Smaller but obvious left inguinal hernia. Both testes descended. To get his right inguinal hernia reduced he had to lie supine and I had to do some slow but forcible manual reduction but ultimately got it completely popped back in. During the supine maneuver he was unable to completely extend his left hip and left knee.   Neurologic Neurologic evaluation reveals -alert and oriented x 3 with no impairment of recent or remote memory. Mental Status-Normal.  Musculoskeletal Normal Exam - Left-Upper Extremity Strength Normal and Lower Extremity Strength Normal. Normal Exam - Right-Upper Extremity Strength Normal and Lower Extremity Strength Normal.  Lymphatic Head & Neck  General Head & Neck Lymphatics: Bilateral - Description - Normal. Axillary  General Axillary Region: Bilateral - Description - Normal. Tenderness - Non Tender. Femoral & Inguinal  Generalized Femoral & Inguinal Lymphatics: Bilateral - Description - Normal. Tenderness - Non Tender.    Assessment & Plan  BILATERAL INGUINAL HERNIA WITHOUT OBSTRUCTION OR GANGRENE (K40.20    You have bilateral inguinal hernias The hernia on the right is much larger and I had to forcibly reduce this but it is reducible The hernia on the left is obvious but much smaller These will continue to  enlarge until you have an operation  You  have requested that we repair the hernias before the knee replacements.  You may continue to work, but heavy lifting will aggravate the discomfort in your groins After you have the hernia surgery there will be no lifting more than 20 pounds for 5 weeks  you will be scheduled for open repair of bilateral inguinal hernias with mesh. We will keep you in the hospital one night for observation I have discussed the indications, techniques, and risk of this surgery with you in detail  [place] (M17.10) SLEEP APNEA IN ADULT (G47.30)   Edsel Petrin. Dalbert Batman, M.D., [facility] General and Minimally invasive Surgery Breast and Colorectal Surgery Office:   [phone] Pager:   [phone]

## 2018-01-22 ENCOUNTER — Encounter (HOSPITAL_BASED_OUTPATIENT_CLINIC_OR_DEPARTMENT_OTHER)
Admission: RE | Disposition: A | Discharge status: Home / Self Care | Payer: Self-pay | Source: Ambulatory Visit | Attending: General Surgery

## 2018-01-22 ENCOUNTER — Ambulatory Visit (HOSPITAL_BASED_OUTPATIENT_CLINIC_OR_DEPARTMENT_OTHER): Payer: BLUE CROSS/BLUE SHIELD | Admitting: Certified Registered"

## 2018-01-22 ENCOUNTER — Ambulatory Visit (HOSPITAL_BASED_OUTPATIENT_CLINIC_OR_DEPARTMENT_OTHER)
Admission: RE | Admit: 2018-01-22 | Discharge: 2018-01-22 | Disposition: A | Discharge status: Home / Self Care | Payer: BLUE CROSS/BLUE SHIELD | Source: Ambulatory Visit | Attending: General Surgery | Admitting: General Surgery

## 2018-01-22 ENCOUNTER — Encounter (HOSPITAL_BASED_OUTPATIENT_CLINIC_OR_DEPARTMENT_OTHER): Payer: Self-pay | Admitting: Certified Registered"

## 2018-01-22 ENCOUNTER — Other Ambulatory Visit: Payer: Self-pay

## 2018-01-22 DIAGNOSIS — K4031 Unilateral inguinal hernia, with obstruction, without gangrene, recurrent: Secondary | ICD-10-CM | POA: Insufficient documentation

## 2018-01-22 DIAGNOSIS — K649 Unspecified hemorrhoids: Secondary | ICD-10-CM | POA: Insufficient documentation

## 2018-01-22 DIAGNOSIS — Z880 Allergy status to penicillin: Secondary | ICD-10-CM | POA: Diagnosis not present

## 2018-01-22 DIAGNOSIS — K4090 Unilateral inguinal hernia, without obstruction or gangrene, not specified as recurrent: Secondary | ICD-10-CM

## 2018-01-22 DIAGNOSIS — M1990 Unspecified osteoarthritis, unspecified site: Secondary | ICD-10-CM | POA: Diagnosis not present

## 2018-01-22 DIAGNOSIS — G4730 Sleep apnea, unspecified: Secondary | ICD-10-CM | POA: Diagnosis not present

## 2018-01-22 DIAGNOSIS — Z79899 Other long term (current) drug therapy: Secondary | ICD-10-CM | POA: Diagnosis not present

## 2018-01-22 DIAGNOSIS — Z8249 Family history of ischemic heart disease and other diseases of the circulatory system: Secondary | ICD-10-CM | POA: Insufficient documentation

## 2018-01-22 DIAGNOSIS — Z809 Family history of malignant neoplasm, unspecified: Secondary | ICD-10-CM | POA: Diagnosis not present

## 2018-01-22 DIAGNOSIS — K4091 Unilateral inguinal hernia, without obstruction or gangrene, recurrent: Secondary | ICD-10-CM

## 2018-01-22 DIAGNOSIS — K4020 Bilateral inguinal hernia, without obstruction or gangrene, not specified as recurrent: Secondary | ICD-10-CM | POA: Diagnosis present

## 2018-01-22 HISTORY — DX: Unilateral inguinal hernia, without obstruction or gangrene, recurrent: K40.91

## 2018-01-22 HISTORY — PX: INGUINAL HERNIA REPAIR: SHX194

## 2018-01-22 HISTORY — DX: Unilateral inguinal hernia, without obstruction or gangrene, not specified as recurrent: K40.90

## 2018-01-22 HISTORY — PX: INSERTION OF MESH: SHX5868

## 2018-01-22 HISTORY — DX: Sleep apnea, unspecified: G47.30

## 2018-01-22 SURGERY — REPAIR, HERNIA, INGUINAL, BILATERAL, ADULT
Anesthesia: General | Site: Groin | Laterality: Bilateral

## 2018-01-22 MED ORDER — DEXAMETHASONE SODIUM PHOSPHATE 4 MG/ML IJ SOLN
INTRAMUSCULAR | Status: DC | PRN
  Administered 2018-01-22: 10 mg via INTRAVENOUS

## 2018-01-22 MED ORDER — ROCURONIUM BROMIDE 100 MG/10ML IV SOLN
INTRAVENOUS | Status: DC | PRN
  Administered 2018-01-22: 40 mg via INTRAVENOUS

## 2018-01-22 MED ORDER — 0.9 % SODIUM CHLORIDE (POUR BTL) OPTIME
TOPICAL | Status: DC | PRN
  Administered 2018-01-22: 1000 mL

## 2018-01-22 MED ORDER — ACETAMINOPHEN 500 MG PO TABS
1000.0000 mg | ORAL_TABLET | ORAL | Status: AC
  Administered 2018-01-22: 1000 mg via ORAL

## 2018-01-22 MED ORDER — CELECOXIB 200 MG PO CAPS
ORAL_CAPSULE | ORAL | Status: AC
  Filled 2018-01-22: qty 1

## 2018-01-22 MED ORDER — DEXAMETHASONE SODIUM PHOSPHATE 10 MG/ML IJ SOLN
INTRAMUSCULAR | Status: AC
  Filled 2018-01-22: qty 2

## 2018-01-22 MED ORDER — FENTANYL CITRATE (PF) 100 MCG/2ML IJ SOLN
50.0000 ug | INTRAMUSCULAR | Status: AC | PRN
  Administered 2018-01-22: 50 ug via INTRAVENOUS
  Administered 2018-01-22: 25 ug via INTRAVENOUS
  Administered 2018-01-22: 100 ug via INTRAVENOUS

## 2018-01-22 MED ORDER — BUPIVACAINE LIPOSOME 1.3 % IJ SUSP: 20.0000 mL | Freq: Once | INTRAMUSCULAR | Status: DC

## 2018-01-22 MED ORDER — LACTATED RINGERS IV SOLN
INTRAVENOUS | Status: DC
  Administered 2018-01-22 (×2): via INTRAVENOUS

## 2018-01-22 MED ORDER — SUGAMMADEX SODIUM 200 MG/2ML IV SOLN
INTRAVENOUS | Status: AC
  Filled 2018-01-22: qty 2

## 2018-01-22 MED ORDER — ACETAMINOPHEN 500 MG PO TABS
ORAL_TABLET | ORAL | Status: AC
  Filled 2018-01-22: qty 2

## 2018-01-22 MED ORDER — CEFAZOLIN SODIUM-DEXTROSE 2-4 GM/100ML-% IV SOLN: 2.0000 g | INTRAVENOUS | Status: DC

## 2018-01-22 MED ORDER — SCOPOLAMINE 1 MG/3DAYS TD PT72: 1.0000 | MEDICATED_PATCH | Freq: Once | TRANSDERMAL | Status: DC | PRN

## 2018-01-22 MED ORDER — ONDANSETRON HCL 4 MG/2ML IJ SOLN
INTRAMUSCULAR | Status: AC
  Filled 2018-01-22: qty 8

## 2018-01-22 MED ORDER — BUPIVACAINE LIPOSOME 1.3 % IJ SUSP
INTRAMUSCULAR | Status: DC | PRN
  Administered 2018-01-22 (×2): 20 mL

## 2018-01-22 MED ORDER — VANCOMYCIN HCL IN DEXTROSE 1-5 GM/200ML-% IV SOLN
INTRAVENOUS | Status: AC
  Filled 2018-01-22: qty 200

## 2018-01-22 MED ORDER — MIDAZOLAM HCL 2 MG/2ML IJ SOLN
1.0000 mg | INTRAMUSCULAR | Status: DC | PRN
  Administered 2018-01-22: 2 mg via INTRAVENOUS

## 2018-01-22 MED ORDER — LIDOCAINE HCL (CARDIAC) PF 100 MG/5ML IV SOSY
PREFILLED_SYRINGE | INTRAVENOUS | Status: AC
  Filled 2018-01-22: qty 15

## 2018-01-22 MED ORDER — FENTANYL CITRATE (PF) 100 MCG/2ML IJ SOLN
INTRAMUSCULAR | Status: AC
  Filled 2018-01-22: qty 2

## 2018-01-22 MED ORDER — PROPOFOL 500 MG/50ML IV EMUL
INTRAVENOUS | Status: AC
  Filled 2018-01-22: qty 100

## 2018-01-22 MED ORDER — FENTANYL CITRATE (PF) 100 MCG/2ML IJ SOLN: 25.0000 ug | INTRAMUSCULAR | Status: DC | PRN

## 2018-01-22 MED ORDER — CELECOXIB 200 MG PO CAPS
200.0000 mg | ORAL_CAPSULE | ORAL | Status: AC
  Administered 2018-01-22: 200 mg via ORAL

## 2018-01-22 MED ORDER — CHLORHEXIDINE GLUCONATE CLOTH 2 % EX PADS: 6.0000 | MEDICATED_PAD | Freq: Once | CUTANEOUS | Status: DC

## 2018-01-22 MED ORDER — BUPIVACAINE LIPOSOME 1.3 % IJ SUSP
INTRAMUSCULAR | Status: AC
  Filled 2018-01-22: qty 20

## 2018-01-22 MED ORDER — SUCCINYLCHOLINE CHLORIDE 20 MG/ML IJ SOLN
INTRAMUSCULAR | Status: DC | PRN
  Administered 2018-01-22: 100 mg via INTRAVENOUS

## 2018-01-22 MED ORDER — HYDROCODONE-ACETAMINOPHEN 5-325 MG PO TABS: 1.0000 | ORAL_TABLET | Freq: Four times a day (QID) | ORAL | 0 refills | Status: DC | PRN

## 2018-01-22 MED ORDER — SUGAMMADEX SODIUM 200 MG/2ML IV SOLN
INTRAVENOUS | Status: DC | PRN
  Administered 2018-01-22: 200 mg via INTRAVENOUS

## 2018-01-22 MED ORDER — SODIUM CHLORIDE 0.9 % IJ SOLN
INTRAMUSCULAR | Status: AC
  Filled 2018-01-22: qty 20

## 2018-01-22 MED ORDER — KETOROLAC TROMETHAMINE 30 MG/ML IJ SOLN
INTRAMUSCULAR | Status: AC
  Filled 2018-01-22: qty 1

## 2018-01-22 MED ORDER — PROMETHAZINE HCL 25 MG/ML IJ SOLN: 6.2500 mg | INTRAMUSCULAR | Status: DC | PRN

## 2018-01-22 MED ORDER — VANCOMYCIN HCL IN DEXTROSE 1-5 GM/200ML-% IV SOLN
1000.0000 mg | Freq: Once | INTRAVENOUS | Status: AC
  Administered 2018-01-22: 1000 mg via INTRAVENOUS

## 2018-01-22 MED ORDER — GABAPENTIN 300 MG PO CAPS
300.0000 mg | ORAL_CAPSULE | ORAL | Status: AC
  Administered 2018-01-22: 300 mg via ORAL

## 2018-01-22 MED ORDER — GABAPENTIN 300 MG PO CAPS
ORAL_CAPSULE | ORAL | Status: AC
  Filled 2018-01-22: qty 1

## 2018-01-22 MED ORDER — BUPIVACAINE-EPINEPHRINE (PF) 0.25% -1:200000 IJ SOLN
INTRAMUSCULAR | Status: DC | PRN
  Administered 2018-01-22 (×2): 30 mg

## 2018-01-22 MED ORDER — ROCURONIUM BROMIDE 10 MG/ML (PF) SYRINGE
PREFILLED_SYRINGE | INTRAVENOUS | Status: AC
  Filled 2018-01-22: qty 10

## 2018-01-22 MED ORDER — MIDAZOLAM HCL 2 MG/2ML IJ SOLN
INTRAMUSCULAR | Status: AC
  Filled 2018-01-22: qty 2

## 2018-01-22 MED ORDER — ONDANSETRON HCL 4 MG/2ML IJ SOLN
INTRAMUSCULAR | Status: DC | PRN
  Administered 2018-01-22: 4 mg via INTRAVENOUS

## 2018-01-22 SURGICAL SUPPLY — 43 items
BLADE CLIPPER SURG (BLADE) ×3 IMPLANT
BLADE HEX COATED 2.75 (ELECTRODE) ×3 IMPLANT
BLADE SURG 10 STRL SS (BLADE) ×3 IMPLANT
CANISTER SUCT 1200ML W/VALVE (MISCELLANEOUS) ×3 IMPLANT
CHLORAPREP W/TINT 26ML (MISCELLANEOUS) ×3 IMPLANT
COVER BACK TABLE 60X90IN (DRAPES) ×6 IMPLANT
COVER MAYO STAND STRL (DRAPES) ×3 IMPLANT
DERMABOND ADVANCED (GAUZE/BANDAGES/DRESSINGS) ×2
DERMABOND ADVANCED .7 DNX12 (GAUZE/BANDAGES/DRESSINGS) ×1 IMPLANT
DRAIN PENROSE 1/2X12 LTX STRL (WOUND CARE) ×3 IMPLANT
DRAPE LAPAROTOMY TRNSV 102X78 (DRAPE) ×3 IMPLANT
DRAPE UTILITY XL STRL (DRAPES) ×3 IMPLANT
ELECT REM PT RETURN 9FT ADLT (ELECTROSURGICAL) ×3
ELECTRODE REM PT RTRN 9FT ADLT (ELECTROSURGICAL) ×1 IMPLANT
GLOVE BIO SURGEON STRL SZ 6.5 (GLOVE) ×2 IMPLANT
GLOVE BIO SURGEON STRL SZ7 (GLOVE) ×3 IMPLANT
GLOVE BIO SURGEONS STRL SZ 6.5 (GLOVE) ×1
GLOVE BIOGEL PI IND STRL 6.5 (GLOVE) ×1 IMPLANT
GLOVE BIOGEL PI INDICATOR 6.5 (GLOVE) ×2
GLOVE EUDERMIC 7 POWDERFREE (GLOVE) ×3 IMPLANT
GOWN STRL REUS W/ TWL LRG LVL3 (GOWN DISPOSABLE) ×2 IMPLANT
GOWN STRL REUS W/ TWL XL LVL3 (GOWN DISPOSABLE) ×1 IMPLANT
GOWN STRL REUS W/TWL LRG LVL3 (GOWN DISPOSABLE) ×4
GOWN STRL REUS W/TWL XL LVL3 (GOWN DISPOSABLE) ×2
MESH ULTRAPRO 3X6 7.6X15CM (Mesh General) ×6 IMPLANT
NEEDLE HYPO 22GX1.5 SAFETY (NEEDLE) ×3 IMPLANT
NS IRRIG 1000ML POUR BTL (IV SOLUTION) ×3 IMPLANT
PACK BASIN DAY SURGERY FS (CUSTOM PROCEDURE TRAY) ×3 IMPLANT
PENCIL BUTTON HOLSTER BLD 10FT (ELECTRODE) ×3 IMPLANT
SLEEVE SCD COMPRESS KNEE MED (MISCELLANEOUS) ×3 IMPLANT
SPONGE LAP 4X18 RFD (DISPOSABLE) ×12 IMPLANT
SUT MNCRL AB 4-0 PS2 18 (SUTURE) ×6 IMPLANT
SUT PROLENE 2 0 CT2 30 (SUTURE) ×18 IMPLANT
SUT SILK 2 0 TIES 17X18 (SUTURE) ×2
SUT SILK 2-0 18XBRD TIE BLK (SUTURE) ×1 IMPLANT
SUT VIC AB 2-0 CT1 27 (SUTURE) ×8
SUT VIC AB 2-0 CT1 TAPERPNT 27 (SUTURE) ×4 IMPLANT
SUT VICRYL 3-0 CR8 SH (SUTURE) ×3 IMPLANT
TOWEL OR NON WOVEN STRL DISP B (DISPOSABLE) ×3 IMPLANT
TRAY DSU PREP LF (CUSTOM PROCEDURE TRAY) ×3 IMPLANT
TUBE CONNECTING 20'X1/4 (TUBING) ×1
TUBE CONNECTING 20X1/4 (TUBING) ×2 IMPLANT
YANKAUER SUCT BULB TIP NO VENT (SUCTIONS) ×3 IMPLANT

## 2018-01-22 NOTE — Anesthesia Procedure Notes (Signed)
Anesthesia Regional Block: TAP block   Pre-Anesthetic Checklist: ,, timeout performed, Correct Patient, Correct Site, Correct Laterality, Correct Procedure, Correct Position, site marked, Risks and benefits discussed,  Surgical consent,  Pre-op evaluation,  At surgeon's request and post-op pain management  Laterality: Left  Prep: chloraprep       Needles:  Injection technique: Single-shot  Needle Type: Echogenic Stimulator Needle     Needle Length: 10cm  Needle Gauge: 21     Additional Needles:   Narrative:  Start time: 01/22/2018 8:01 AM End time: 01/22/2018 8:08 AM Injection made incrementally with aspirations every 5 mL.  Performed by: Personally

## 2018-01-22 NOTE — Anesthesia Procedure Notes (Signed)
Anesthesia Regional Block: TAP block   Pre-Anesthetic Checklist: ,, timeout performed, Correct Patient, Correct Site, Correct Laterality, Correct Procedure, Correct Position, site marked, Risks and benefits discussed,  Surgical consent,  Pre-op evaluation,  At surgeon's request and post-op pain management  Laterality: Right  Prep: chloraprep       Needles:  Injection technique: Single-shot  Needle Type: Echogenic Stimulator Needle     Needle Length: 10cm  Needle Gauge: 21     Additional Needles:   Narrative:  Start time: 01/22/2018 8:08 AM End time: 01/22/2018 8:16 AM Injection made incrementally with aspirations every 5 mL.  Performed by: Personally

## 2018-01-22 NOTE — Discharge Instructions (Signed)
Post Anesthesia Home Care Instructions  Activity: Get plenty of rest for the remainder of the day. A responsible individual must stay with you for 24 hours following the procedure.  For the next 24 hours, DO NOT: -Drive a car -Paediatric nurse -Drink alcoholic beverages -Take any medication unless instructed by your physician -Make any legal decisions or sign important papers.  Meals: Start with liquid foods such as gelatin or soup. Progress to regular foods as tolerated. Avoid greasy, spicy, heavy foods. If nausea and/or vomiting occur, drink only clear liquids until the nausea and/or vomiting subsides. Call your physician if vomiting continues.  Special Instructions/Symptoms: Your throat may feel dry or sore from the anesthesia or the breathing tube placed in your throat during surgery. If this causes discomfort, gargle with warm salt water. The discomfort should disappear within 24 hours.  If you had a scopolamine patch placed behind your ear for the management of post- operative nausea and/or vomiting:  1. The medication in the patch is effective for 72 hours, after which it should be removed.  Wrap patch in a tissue and discard in the trash. Wash hands thoroughly with soap and water. 2. You may remove the patch earlier than 72 hours if you experience unpleasant side effects which may include dry mouth, dizziness or visual disturbances. 3. Avoid touching the patch. Wash your hands with soap and water after contact with the patch.       [facility] _______[facility]  UMBILICAL OR INGUINAL HERNIA REPAIR: POST OP INSTRUCTIONS  Always review your discharge instruction sheet given to you by the facility where your surgery was performed. IF YOU HAVE DISABILITY OR FAMILY LEAVE FORMS, YOU MUST BRING THEM TO THE OFFICE FOR PROCESSING.   DO NOT GIVE THEM TO YOUR DOCTOR.  1. A  prescription for pain medication may be given to you upon discharge.  Take your pain medication as  prescribed, if needed.  If narcotic pain medicine is not needed, then you may take acetaminophen (Tylenol) or ibuprofen (Advil) as needed. 2. Take your usually prescribed medications unless otherwise directed. If you need a refill on your pain medication, please contact your pharmacy.  They will contact our office to request authorization. Prescriptions will not be filled after 5 pm or on week-ends. 3. You should follow a light diet the first 24 hours after arrival home, such as soup and crackers, etc.  Be sure to include lots of fluids daily.  Resume your normal diet the day after surgery. 4.Most patients will experience some swelling and bruising around the umbilicus or in the groin and scrotum.  Ice packs and reclining will help.  Swelling and bruising can take several days to resolve.  6. It is common to experience some constipation if taking pain medication after surgery.  Increasing fluid intake and taking a stool softener (such as Colace) will usually help or prevent this problem from occurring.  A mild laxative (Milk of Magnesia or Miralax) should be taken according to package directions if there are no bowel movements after 48 hours. 7. Unless discharge instructions indicate otherwise, you may remove your bandages 24-48 hours after surgery, and you may shower at that time.  You may have steri-strips (small skin tapes) in place directly over the incision.  These strips should be left on the skin for 7-10 days.  If your surgeon used skin glue on the incision, you may shower in 24 hours.  The glue will flake off over the next 2-3 weeks.  Any sutures or staples will be removed at the office during your follow-up visit. 8. ACTIVITIES:  You may resume regular (light) daily activities beginning the next day--such as daily self-care, walking, climbing stairs--gradually increasing activities as tolerated.  You may have sexual intercourse when it is comfortable.  Refrain from any heavy lifting or straining  until approved by your doctor.  a.You may drive when you are no longer taking prescription pain medication, you can comfortably wear a seatbelt, and you can safely maneuver your car and apply brakes. b.RETURN TO WORK:   _____________________________________________  9.You should see your doctor in the office for a follow-up appointment approximately 2-3 weeks after your surgery.  Make sure that you call for this appointment within a day or two after you arrive home to insure a convenient appointment time. 10.OTHER INSTRUCTIONS: _________________________    _____________________________________  WHEN TO CALL YOUR DOCTOR: 1. Fever over 101.0 2. Inability to urinate 3. Nausea and/or vomiting 4. Extreme swelling or bruising 5. Continued bleeding from incision. 6. Increased pain, redness, or drainage from the incision  The clinic staff is available to answer your questions during regular business hours.  Please dont hesitate to call and ask to speak to one of the nurses for clinical concerns.  If you have a medical emergency, go to the nearest emergency room or call 911.  A surgeon from [facility] is always on call at the hospital   [address], [place], [place], [place]  [address] ?  [address], [place], [place]   [address] [phone] ? [phone] ? FAX [phone] Web site: [email or web address]

## 2018-01-22 NOTE — Transfer of Care (Signed)
Immediate Anesthesia Transfer of Care Note  Patient: Daniel Caldwell  Procedure(s) Performed: OPEN BILATERAL INGUINAL HERNIA REPAIR ERAS PATHWAY (Bilateral Groin) INSERTION OF MESH (Bilateral Groin)  Patient Location: PACU  Anesthesia Type:GA combined with regional for post-op pain  Level of Consciousness: awake and patient cooperative  Airway & Oxygen Therapy: Patient Spontanous Breathing and Patient connected to face mask oxygen  Post-op Assessment: Report given to RN and Post -op Vital signs reviewed and stable  Post vital signs: Reviewed and stable  Last Vitals:  Vitals Value Taken Time  BP    Temp    Pulse    Resp    SpO2      Last Pain:  Vitals:   01/22/18 0725  TempSrc: Oral         Complications: No apparent anesthesia complications

## 2018-01-22 NOTE — Anesthesia Postprocedure Evaluation (Signed)
Anesthesia Post Note  Patient: Daniel Caldwell  Procedure(s) Performed: OPEN BILATERAL INGUINAL HERNIA REPAIR ERAS PATHWAY (Bilateral Groin) INSERTION OF MESH (Bilateral Groin)     Patient location during evaluation: PACU Anesthesia Type: General Level of consciousness: sedated Pain management: pain level controlled Vital Signs Assessment: post-procedure vital signs reviewed and stable Respiratory status: spontaneous breathing and respiratory function stable Cardiovascular status: stable Postop Assessment: no apparent nausea or vomiting Anesthetic complications: no    Last Vitals:  Vitals:   01/22/18 1200 01/22/18 1215  BP: 133/90 133/80  Pulse: (!) 55 69  Resp: 16   Temp:    SpO2: 100% 100%    Last Pain:  Vitals:   01/22/18 1225  TempSrc:   PainSc: 2                  Charnise Lovan DANIEL

## 2018-01-22 NOTE — Anesthesia Preprocedure Evaluation (Addendum)
Anesthesia Evaluation  Patient identified by MRN, date of birth, ID band Patient awake    Reviewed: Allergy & Precautions, NPO status , Patient's Chart, lab work & pertinent test results  History of Anesthesia Complications Negative for: history of anesthetic complications  Airway Mallampati: III  TM Distance: >3 FB Neck ROM: Full  Mouth opening: Limited Mouth Opening  Dental no notable dental hx. (+) Dental Advisory Given   Pulmonary sleep apnea ,    Pulmonary exam normal        Cardiovascular negative cardio ROS Normal cardiovascular exam     Neuro/Psych negative neurological ROS  negative psych ROS   GI/Hepatic Neg liver ROS,   Endo/Other  negative endocrine ROS  Renal/GU negative Renal ROS  negative genitourinary   Musculoskeletal negative musculoskeletal ROS (+)   Abdominal   Peds negative pediatric ROS (+)  Hematology negative hematology ROS (+)   Anesthesia Other Findings   Reproductive/Obstetrics negative OB ROS                            Anesthesia Physical Anesthesia Plan  ASA: I  Anesthesia Plan: General   Post-op Pain Management:  Regional for Post-op pain   Induction: Intravenous  PONV Risk Score and Plan: 3 and Ondansetron, Dexamethasone and Scopolamine patch - Pre-op  Airway Management Planned: LMA and Oral ETT  Additional Equipment:   Intra-op Plan:   Post-operative Plan: Extubation in OR  Informed Consent:   Plan Discussed with:   Anesthesia Plan Comments:         Anesthesia Quick Evaluation

## 2018-01-22 NOTE — Progress Notes (Signed)
Assisted Dr. Singer with right, left, ultrasound guided, transabdominal plane block. Side rails up, monitors on throughout procedure. See vital signs in flow sheet. Tolerated Procedure well. ° °

## 2018-01-22 NOTE — Interval H&P Note (Signed)
History and Physical Interval Note:  01/22/2018 8:33 AM  [name]  has presented today for surgery, with the diagnosis of Bilateral inguinal hernia  The various methods of treatment have been discussed with the patient and family. After consideration of risks, benefits and other options for treatment, the patient has consented to  Procedure(s): OPEN Hughes (Bilateral) INSERTION OF MESH (Bilateral) as a surgical intervention .  The patient's history has been reviewed, patient examined, no change in status, stable for surgery.  I have reviewed the patient's chart and labs.  Questions were answered to the patient's satisfaction.     Daniel Caldwell

## 2018-01-22 NOTE — Anesthesia Procedure Notes (Signed)
Procedure Name: Intubation Date/Time: 01/22/2018 8:52 AM Performed by: Signe Colt, CRNA Pre-anesthesia Checklist: Patient identified, Emergency Drugs available, Suction available and Patient being monitored Patient Re-evaluated:Patient Re-evaluated prior to induction Oxygen Delivery Method: Circle system utilized Preoxygenation: Pre-oxygenation with 100% oxygen Induction Type: IV induction Ventilation: Mask ventilation without difficulty Laryngoscope Size: Mac, 3 and Glidescope Grade View: Grade III Tube type: Oral Tube size: 7.0 mm Number of attempts: 2 Airway Equipment and Method: Stylet and Oral airway Placement Confirmation: ETT inserted through vocal cords under direct vision,  positive ETCO2 and breath sounds checked- equal and bilateral Secured at: 21 cm Tube secured with: Tape Dental Injury: Teeth and Oropharynx as per pre-operative assessment  Difficulty Due To: Difficulty was anticipated and Difficult Airway- due to anterior larynx Comments: Patient with receeding chin TM-2, small oral opening, teeth intact, ------- glide scope in room, plan with DR Tobias Alexander, smooth IV induction with anectine, easy mask, DL x 1 with Mac 3 grade 3 view unable to pass tube gently, switch to glide scope DL x 1 VC visualized easy placement of 7.0 ETT, dentition unchanged, BBS, + ETCO2

## 2018-01-22 NOTE — Op Note (Addendum)
Patient Name:           Daniel Caldwell   Date of Surgery:        01/22/2018  Pre op Diagnosis:      Bilateral inguinal hernia  Post op Diagnosis:    Recurrent right inguinal hernia                                      Left inguinal hernia  Procedure:                 Open repair recurrent right inguinal hernia                                       Open repair left inguinal hernia  Surgeon:                     Edsel Petrin. Dalbert Batman, M.D., FACS  Assistant:                      OR staff  Operative Indications:     This is a 52 year old man, referred by Dr. Eber Hong in [place] for evaluation of bilateral inguinal hernias. Dr. Alphonzo Severance is his orthopedic surgeon planning knee replacement.     The patient has had a right inguinal hernia for many years. Getting a little bit larger. Has some aching discomfort at work now. When Dr. Brynda Greathouse saw him he noted a very large indirect right inguinal hernia and a smaller left inguinal hernia. Patient wants to have these repaired. He is planning left total knee replacement by Dr. Marlou Sa.Marland Kitchen He requested that we repair the hernias first.       Past history significant for possible sleep apnea. Sleep study pending. He denies history of prior hernia surgery. Degenerative joint disease.  Takes oxycodone chronically for knee pain      Exam revealed a very large indirect right inguinal hernia that I had to forcibly reduce and was painful. Smaller but obvious left inguinal hernia Because the right inguinal hernia is so large and partially incarcerated at the regard to have to do this as as an open repair. I feel that there is and increased risk of recurrence with laparoscopic repair     I discussed the indications, details, techniques, and risk of the surgery with him in detail.  He agrees with this plan.    Operative Findings:       On the right side, after he was put to sleep and we clipped his hair we found a right inguinal incision.  The right inguinal  hernia was a recurrent hernia.  There was lots of chronic adhesions and scar tissue in the right inguinal canal.  There was no evidence of mesh.  The left inguinal hernia looked like a first-time hernia repair without any scar tissue.  On the right side he had a recurrent, indirect, sliding inguinal hernia.  On the left side he had a direct inguinal hernia.  Procedure in Detail:          Following the induction of general endotracheal anesthesia the patient's patient's abdomen and genitalia were prepped and draped in a sterile fashion.  Bilateral TAPP block had been performed.  Intravenous antibiotics were given.  Surgical timeout was performed.    A 50% solution  of Exparel was used as a local infiltration anesthetic into all the tissue layers.  Injected 20 cc on each side.    A right inguinal incision was made.  Dissection was carried down through the subcutaneous tissue.  I dissected out the external oblique.  The external oblique was incised in the direction of its fibers, opening up the external inguinal ring.  This was somewhat slow and tedious because of scar tissue.  Sensory nerves were clamped divided and ligated with 2-0 silk ties.  I slowly dissected the cord structures away from the underlying tissues.  No mesh was seen.  I slowly dissected the cord structures away from an indirect sliding hernia.  The indirect hernia was reduced and the internal ring tightened with interrupted sutures of 2-0 Vicryl.  There were no other hernias noted.  The floor of the inguinal canal was repaired and reinforced with an onlay graft of ultra pro mesh.  A 3 inch x 6 inch piece of mesh was brought to the operative field and trimmed at the corners to accommodate the anatomy of the wound.  The mesh was sutured in place with running sutures of 2-0 Prolene and interrupted mattress sutures of 2-0 Prolene.  The mesh was sutured so as to generously overlap the fascia at the pubic tubercle, then along the inguinal ligament  inferiorly.  Medially, superiorly, and superior laterally further mattress sutures of 2-0 Prolene were placed.  The mesh was incised laterally so as to wrap around the cord structures at the internal ring.  Further Prolene sutures were placed laterally.  This provided very secure coverage and repair both medial and lateral to the internal ring but allowed an adequate fingertip opening for the cord structures.  The wound was irrigated.  The external oblique was closed with a running suture of 2-0 Vicryl placing the cord structures deep to the external oblique.  Scarpa's fascia was closed with 3-0 Vicryl suture and the skin closed with a running subcuticular suture of 4-0 Monocryl and Dermabond.     I then turned my attention to the left side.  A mirror image incision was performed.  Dissection was carried down to the external oblique which was incised and retracted in similar fashion.  The cord structures were mobilized and encircled with a Penrose drain.  I dissected a direct hernia sac away from the cord structures.  This was reduced and oversewn with a running suture of 2-0 Vicryl.  Search was made for indirect sac but he did not seem to have an indirect hernia on the left.  A 3 inch x 6 inch piece of ultra pro mesh was brought to the operative field and sutured in place in a essentially identical fashion to the right side.  The wound was irrigated.  Hemostasis was excellent.  The external oblique, Scarpa's fascia, and skin were closed in the same manner as the opposite side.  The patient tolerated the procedure well and was taken to PACU in stable condition.  EBL 25 cc or less.  Counts correct.  Complications none.    He did very well with surgery.  He was quite comfortable in PACU.  He did so well that  we decided to see if he would be a candidate for discharge home same day.    Addendum: I logged onto the [employer] and reviewed his prescription medication history     Bijal Siglin M. Dalbert Batman, M.D.,  FACS General and Minimally Invasive Surgery Breast and Colorectal Surgery  01/22/2018  11:19 AM

## 2018-01-23 ENCOUNTER — Inpatient Hospital Stay (HOSPITAL_COMMUNITY): Admit: 2018-01-23 | Payer: BLUE CROSS/BLUE SHIELD | Admitting: Orthopedic Surgery

## 2018-01-23 ENCOUNTER — Encounter (HOSPITAL_BASED_OUTPATIENT_CLINIC_OR_DEPARTMENT_OTHER): Payer: Self-pay | Admitting: General Surgery

## 2018-01-23 ENCOUNTER — Encounter (HOSPITAL_COMMUNITY): Payer: Self-pay

## 2018-01-23 SURGERY — ARTHROPLASTY, KNEE, TOTAL
Anesthesia: General | Laterality: Left

## 2018-03-09 ENCOUNTER — Encounter (INDEPENDENT_AMBULATORY_CARE_PROVIDER_SITE_OTHER): Payer: Self-pay | Admitting: Orthopedic Surgery

## 2018-03-09 ENCOUNTER — Ambulatory Visit (INDEPENDENT_AMBULATORY_CARE_PROVIDER_SITE_OTHER): Payer: BLUE CROSS/BLUE SHIELD | Admitting: Orthopedic Surgery

## 2018-03-09 ENCOUNTER — Ambulatory Visit (INDEPENDENT_AMBULATORY_CARE_PROVIDER_SITE_OTHER): Payer: Self-pay

## 2018-03-09 VITALS — Ht 70.0 in | Wt 175.0 lb

## 2018-03-09 DIAGNOSIS — M1712 Unilateral primary osteoarthritis, left knee: Secondary | ICD-10-CM | POA: Diagnosis not present

## 2018-03-09 DIAGNOSIS — M25571 Pain in right ankle and joints of right foot: Secondary | ICD-10-CM | POA: Diagnosis not present

## 2018-03-09 MED ORDER — LIDOCAINE HCL 1 % IJ SOLN
5.0000 mL | INTRAMUSCULAR | Status: AC | PRN
  Administered 2018-03-09: 5 mL

## 2018-03-09 MED ORDER — BUPIVACAINE HCL 0.25 % IJ SOLN
4.0000 mL | INTRAMUSCULAR | Status: AC | PRN
  Administered 2018-03-09: 4 mL via INTRA_ARTICULAR

## 2018-03-09 MED ORDER — METHYLPREDNISOLONE ACETATE 40 MG/ML IJ SUSP
40.0000 mg | INTRAMUSCULAR | Status: AC | PRN
  Administered 2018-03-09: 40 mg via INTRA_ARTICULAR

## 2018-03-09 NOTE — Progress Notes (Signed)
Office Visit Note   Patient: Daniel Caldwell           Date of Birth: March 02, 1966           MRN: [id number] Visit Date: 03/09/2018 Requested by: No referring provider defined for this encounter. PCP: System, Pcp Not In  Subjective: Chief Complaint  Patient presents with  . Right Ankle - Pain  . Left Knee - Pain    HPI: Patient presents with right ankle pain.  Sustained an inversion injury 03/01/2018.  Twisted his ankle and went to urgent care in [place].  Radiographs there are reviewed and do not show any fracture on 3 views.  Scheduled to go back to work on 9 3 but he is limping significantly and is having difficulty ambulating.  He also has known history of left knee arthritis and is requesting cortisone injection today.  He is getting over hernia surgery.  States that the ankle swells with activity.  His knee pain is also worsening gradually on the left-hand side.              ROS: All systems reviewed are negative as they relate to the chief complaint within the history of present illness.  Patient denies  fevers or chills.   Assessment & Plan: Visit Diagnoses:  1. Pain in right ankle and joints of right foot   2. Unilateral primary osteoarthritis, left knee     Plan: Impression is right ankle sprain without evidence of fracture.  Plan is lace up ankle brace with progressive ambulation.  I think is good to be out of work for a couple of weeks due to his diminished ambulatory ability and aggravation of existing arthritis in the left knee from his right ankle injury.  Left knee is also injected today.  I think he should be okay to return to work around 25 September.  I will see him back later this year for further injections of the knee which is likely based on his history.  At some point time he will need knee replacement on that left-hand side  Follow-Up Instructions: Return if symptoms worsen or fail to improve.   Orders:  No orders of the defined types were placed in this  encounter.  No orders of the defined types were placed in this encounter.     Procedures: Large Joint Inj: L knee on 03/09/2018 10:00 PM Indications: diagnostic evaluation, joint swelling and pain Details: 18 G 1.5 in needle, superolateral approach  Arthrogram: No  Medications: 5 mL lidocaine 1 %; 40 mg methylPREDNISolone acetate 40 MG/ML; 4 mL bupivacaine 0.25 % Outcome: tolerated well, no immediate complications Procedure, treatment alternatives, risks and benefits explained, specific risks discussed. Consent was given by the patient. Immediately prior to procedure a time out was called to verify the correct patient, procedure, equipment, support staff and site/side marked as required. Patient was prepped and draped in the usual sterile fashion.       Clinical Data: No additional findings.  Objective: Vital Signs: Ht 5\' 10"  (1.778 m)   Wt 175 lb (79.4 kg)   BMI 25.11 kg/m   Physical Exam:   Constitutional: Patient appears well-developed HEENT:  Head: Normocephalic Eyes:EOM are normal Neck: Normal range of motion Cardiovascular: Normal rate Pulmonary/chest: Effort normal Neurologic: Patient is alert Skin: Skin is warm Psychiatric: Patient has normal mood and affect    Ortho Exam: Ortho exam demonstrates full active and passive range of motion of the left ankle.  On the right-hand  side he has tenderness both over the medial malleolus and the lateral malleolus.  Anterior to posterior to peroneal and Achilles tendons functionally intact and nontender.  Pedal pulses palpable.  No other masses lymphadenopathy or skin changes noted in that right ankle region.  His left knee has trace effusion in 15 degree flexion contracture.  Extensor mechanism is intact  Specialty Comments:  No specialty comments available.  Imaging: No results found.   PMFS History: Patient Active Problem List   Diagnosis Date Noted  . Recurrent right inguinal hernia 01/22/2018  . Left inguinal  hernia 01/22/2018   Past Medical History:  Diagnosis Date  . Left inguinal hernia 01/22/2018  . Recurrent right inguinal hernia 01/22/2018  . Sleep apnea    Dx week of 01/15/2018  has not  gotten fitted yet    History reviewed. No pertinent family history.  Past Surgical History:  Procedure Laterality Date  . INGUINAL HERNIA REPAIR Bilateral 01/22/2018   Procedure: OPEN BILATERAL INGUINAL HERNIA REPAIR ERAS PATHWAY;  Surgeon: Fanny Skates, MD;  Location: [facility];  Service: General;  Laterality: Bilateral;  . INSERTION OF MESH Bilateral 01/22/2018   Procedure: INSERTION OF MESH;  Surgeon: Fanny Skates, MD;  Location: [facility];  Service: General;  Laterality: Bilateral;  . KNEE SURGERY     Social History   Occupational History  . Not on file  Tobacco Use  . Smoking status: Never Smoker  . Smokeless tobacco: Current User    Types: Chew  Substance and Sexual Activity  . Alcohol use: Yes    Comment: occ  . Drug use: No  . Sexual activity: Not on file

## 2018-03-15 ENCOUNTER — Ambulatory Visit (INDEPENDENT_AMBULATORY_CARE_PROVIDER_SITE_OTHER): Payer: BLUE CROSS/BLUE SHIELD | Admitting: Orthopedic Surgery

## 2018-04-04 ENCOUNTER — Encounter (INDEPENDENT_AMBULATORY_CARE_PROVIDER_SITE_OTHER): Payer: Self-pay | Admitting: Orthopedic Surgery

## 2018-04-04 ENCOUNTER — Ambulatory Visit (INDEPENDENT_AMBULATORY_CARE_PROVIDER_SITE_OTHER): Payer: BLUE CROSS/BLUE SHIELD | Admitting: Orthopedic Surgery

## 2018-04-04 DIAGNOSIS — M25571 Pain in right ankle and joints of right foot: Secondary | ICD-10-CM

## 2018-04-04 MED ORDER — HYDROCODONE-ACETAMINOPHEN 5-325 MG PO TABS: ORAL_TABLET | ORAL | 0 refills | Status: DC

## 2018-04-04 NOTE — Progress Notes (Signed)
Office Visit Note   Patient: Daniel Caldwell           Date of Birth: 02/03/1966           MRN: [id number] Visit Date: 04/04/2018 Requested by: No referring provider defined for this encounter. PCP: System, Pcp Not In  Subjective: Chief Complaint  Patient presents with  . Right Ankle - Follow-up  . Left Knee - Follow-up    HPI: Daniel Caldwell is a patient who is here for follow-up of right ankle injury.  Injured the ankle about 5 weeks ago which is a twisting injury.  He will return to work 15 and it has been hurting him significantly since that time.  Reports soreness and having to walk on the lateral aspect of the foot.  He did have a left knee injection 03/09/2018 which helped some.              ROS: All systems reviewed are negative as they relate to the chief complaint within the history of present illness.  Patient denies  fevers or chills.   Assessment & Plan: Visit Diagnoses:  1. Pain in right ankle and joints of right foot     Plan: Impression is persistent pain in the right ankle following injury over 5 weeks ago.  His exam today shows for some persistent swelling laterally as well as some medial sided tenderness.  I think based on the very physical nature of his work that is possible he may have more than just a ankle sprain.  Plain radiographs normal but he may have a bone bruise or chondral damage to that talar dome.  Plan MRI scan to evaluate for talar dome lesion versus occult fracture.  I will see him back after that study.  Norco prescription written one-time only.  Follow-Up Instructions: Return if symptoms worsen or fail to improve.   Orders:  Orders Placed This Encounter  Procedures  . MR Ankle Right w/o contrast   Meds ordered this encounter  Medications  . HYDROcodone-acetaminophen (NORCO) 5-325 MG tablet    Sig: 1 po q 12-24hrs prn pain    Dispense:  40 tablet    Refill:  0      Procedures: No procedures performed   Clinical Data: No additional  findings.  Objective: Vital Signs: There were no vitals taken for this visit.  Physical Exam:   Constitutional: Patient appears well-developed HEENT:  Head: Normocephalic Eyes:EOM are normal Neck: Normal range of motion Cardiovascular: Normal rate Pulmonary/chest: Effort normal Neurologic: Patient is alert Skin: Skin is warm Psychiatric: Patient has normal mood and affect    Ortho Exam: Ortho exam demonstrates slightly antalgic gait to the right with palpable intact nontender anterior to posterior to peroneal and Achilles tendons.  Does have some medial malleolar tenderness along with sinus Tarsi tenderness.  Ankle stability is pretty reasonable to varus tilt testing and anterior drawer testing.  Pedal pulses palpable.  No bruising or ecchymosis is noted no pain with pronation supination of the forefoot  Specialty Comments:  No specialty comments available.  Imaging: No results found.   PMFS History: Patient Active Problem List   Diagnosis Date Noted  . Recurrent right inguinal hernia 01/22/2018  . Left inguinal hernia 01/22/2018   Past Medical History:  Diagnosis Date  . Left inguinal hernia 01/22/2018  . Recurrent right inguinal hernia 01/22/2018  . Sleep apnea    Dx week of 01/15/2018  has not  gotten fitted yet    History reviewed.  No pertinent family history.  Past Surgical History:  Procedure Laterality Date  . INGUINAL HERNIA REPAIR Bilateral 01/22/2018   Procedure: OPEN BILATERAL INGUINAL HERNIA REPAIR ERAS PATHWAY;  Surgeon: Fanny Skates, MD;  Location: [facility];  Service: General;  Laterality: Bilateral;  . INSERTION OF MESH Bilateral 01/22/2018   Procedure: INSERTION OF MESH;  Surgeon: Fanny Skates, MD;  Location: [facility];  Service: General;  Laterality: Bilateral;  . KNEE SURGERY     Social History   Occupational History  . Not on file  Tobacco Use  . Smoking status: Never Smoker  . Smokeless tobacco: Current  User    Types: Chew  Substance and Sexual Activity  . Alcohol use: Yes    Comment: occ  . Drug use: No  . Sexual activity: Not on file

## 2018-04-13 ENCOUNTER — Ambulatory Visit
Admission: RE | Admit: 2018-04-13 | Discharge: 2018-04-13 | Disposition: A | Discharge status: Home / Self Care | Payer: BLUE CROSS/BLUE SHIELD | Source: Ambulatory Visit | Attending: Orthopedic Surgery | Admitting: Orthopedic Surgery

## 2018-04-13 DIAGNOSIS — M25571 Pain in right ankle and joints of right foot: Secondary | ICD-10-CM

## 2018-04-18 ENCOUNTER — Telehealth (INDEPENDENT_AMBULATORY_CARE_PROVIDER_SITE_OTHER): Payer: Self-pay | Admitting: Orthopedic Surgery

## 2018-04-18 NOTE — Telephone Encounter (Signed)
Patient states he does not want to come in for an MRI review just to be told that "nothing is wrong with him". He would like to see if he could be called with MRI results. # [phone]

## 2018-04-18 NOTE — Telephone Encounter (Signed)
Please advise. Thanks.  

## 2018-04-19 NOTE — Telephone Encounter (Signed)
IC s/w patient and advised. He verbalized understanding.  

## 2018-04-19 NOTE — Telephone Encounter (Signed)
He has torn ankle ligaments but no occult fractures or chondral defects.  It should heal on its own.  He may need to use a brace at work if he is having trouble with prolonged standing.  Other than that no operative intervention needed at this time.  His ankle did sustain a soft tissue injury.  Please call thanks

## 2018-04-23 ENCOUNTER — Ambulatory Visit (INDEPENDENT_AMBULATORY_CARE_PROVIDER_SITE_OTHER): Payer: BLUE CROSS/BLUE SHIELD | Admitting: Orthopedic Surgery

## 2018-10-04 ENCOUNTER — Telehealth (INDEPENDENT_AMBULATORY_CARE_PROVIDER_SITE_OTHER): Payer: Self-pay

## 2018-10-04 NOTE — Telephone Encounter (Signed)
Called patient and asked the screening questions.  Do you have now or have you had in the past 7 days a fever and/or chills? NO  Do you have now or have you had in the past 7 days a cough? NO  Do you have now or have yo0u had in the last 7 days nausea, vomiting or abdominal pain? NO  Have you been exposed to anyone who has tested positive for COVID-19? NO  Have you or anyone who lives with you traveled within the last month? NO 

## 2018-10-08 ENCOUNTER — Ambulatory Visit (INDEPENDENT_AMBULATORY_CARE_PROVIDER_SITE_OTHER): Payer: BLUE CROSS/BLUE SHIELD | Admitting: Orthopedic Surgery

## 2018-11-14 ENCOUNTER — Ambulatory Visit (INDEPENDENT_AMBULATORY_CARE_PROVIDER_SITE_OTHER): Payer: BLUE CROSS/BLUE SHIELD | Admitting: Orthopedic Surgery

## 2018-11-14 ENCOUNTER — Other Ambulatory Visit: Payer: Self-pay

## 2018-11-14 ENCOUNTER — Encounter: Payer: Self-pay | Admitting: Orthopedic Surgery

## 2018-11-14 DIAGNOSIS — M1712 Unilateral primary osteoarthritis, left knee: Secondary | ICD-10-CM

## 2018-11-14 MED ORDER — OXYCODONE-ACETAMINOPHEN 5-325 MG PO TABS: ORAL_TABLET | ORAL | 0 refills | Status: DC

## 2018-11-14 MED ORDER — METHYLPREDNISOLONE ACETATE 40 MG/ML IJ SUSP
40.0000 mg | INTRAMUSCULAR | Status: AC | PRN
  Administered 2018-11-14: 15:00:00 40 mg via INTRA_ARTICULAR

## 2018-11-14 MED ORDER — BUPIVACAINE HCL 0.25 % IJ SOLN
4.0000 mL | INTRAMUSCULAR | Status: AC | PRN
  Administered 2018-11-14: 15:00:00 4 mL via INTRA_ARTICULAR

## 2018-11-14 MED ORDER — LIDOCAINE HCL 1 % IJ SOLN
5.0000 mL | INTRAMUSCULAR | Status: AC | PRN
  Administered 2018-11-14: 5 mL

## 2018-11-14 NOTE — Progress Notes (Signed)
Office Visit Note   Patient: Daniel Caldwell           Date of Birth: March 25, 1966           MRN: [id number] Visit Date: 11/14/2018 Requested by: No referring provider defined for this encounter. PCP: System, Pcp Not In  Subjective: Chief Complaint  Patient presents with  . Left Knee - Pain    HPI: Daniel Caldwell is a patient with left knee pain.  Has known history of left knee lateral compartment arthritis.  He works at a [employer].  He was followed for a month but now he is back.  He does about 14 shifts a month which is difficult.  He has been taking oxycodone occasionally which helps along with anti-inflammatories.  Last injection was several months ago.              ROS: All systems reviewed are negative as they relate to the chief complaint within the history of present illness.  Patient denies  fevers or chills.   Assessment & Plan: Visit Diagnoses:  1. Unilateral primary osteoarthritis, left knee     Plan: Impression is left knee pain with arthritis.  Plan is cortisone injection today.  I think in regards to FMLA papers he would likely need to be potentially out of work about 2 shifts per month due to the grueling nature of his job and the severe arthritis in the left knee.  He may consider knee replacement at sometime in the future.  I think that is what he is having for even though he is only 89.  I will see him back as needed.  Follow-Up Instructions: Return if symptoms worsen or fail to improve.   Orders:  No orders of the defined types were placed in this encounter.  Meds ordered this encounter  Medications  . oxyCODONE-acetaminophen (PERCOCET/ROXICET) 5-325 MG tablet    Sig: 1 po q hs prn pain    Dispense:  30 tablet    Refill:  0      Procedures: Large Joint Inj: L knee on 11/14/2018 2:55 PM Indications: diagnostic evaluation, joint swelling and pain Details: 18 G 1.5 in needle, superolateral approach  Arthrogram: No  Medications: 5 mL lidocaine 1  %; 40 mg methylPREDNISolone acetate 40 MG/ML; 4 mL bupivacaine 0.25 % Outcome: tolerated well, no immediate complications Procedure, treatment alternatives, risks and benefits explained, specific risks discussed. Consent was given by the patient. Immediately prior to procedure a time out was called to verify the correct patient, procedure, equipment, support staff and site/side marked as required. Patient was prepped and draped in the usual sterile fashion.       Clinical Data: No additional findings.  Objective: Vital Signs: There were no vitals taken for this visit.  Physical Exam:   Constitutional: Patient appears well-developed HEENT:  Head: Normocephalic Eyes:EOM are normal Neck: Normal range of motion Cardiovascular: Normal rate Pulmonary/chest: Effort normal Neurologic: Patient is alert Skin: Skin is warm Psychiatric: Patient has normal mood and affect    Ortho Exam: Ortho exam demonstrates about a 10 degree flexion contracture on the left compared to the right.  No effusion is present.  Flexion past 90 is present and extensor mechanism is intact on the left-hand side.  No groin pain with internal and external rotation of that left-hand side.  Specialty Comments:  No specialty comments available.  Imaging: No results found.   PMFS History: Patient Active Problem List   Diagnosis Date Noted  .  Recurrent right inguinal hernia 01/22/2018  . Left inguinal hernia 01/22/2018   Past Medical History:  Diagnosis Date  . Left inguinal hernia 01/22/2018  . Recurrent right inguinal hernia 01/22/2018  . Sleep apnea    Dx week of 01/15/2018  has not  gotten fitted yet    History reviewed. No pertinent family history.  Past Surgical History:  Procedure Laterality Date  . INGUINAL HERNIA REPAIR Bilateral 01/22/2018   Procedure: OPEN BILATERAL INGUINAL HERNIA REPAIR ERAS PATHWAY;  Surgeon: Fanny Skates, MD;  Location: [facility];  Service: General;   Laterality: Bilateral;  . INSERTION OF MESH Bilateral 01/22/2018   Procedure: INSERTION OF MESH;  Surgeon: Fanny Skates, MD;  Location: [facility];  Service: General;  Laterality: Bilateral;  . KNEE SURGERY     Social History   Occupational History  . Not on file  Tobacco Use  . Smoking status: Never Smoker  . Smokeless tobacco: Current User    Types: Chew  Substance and Sexual Activity  . Alcohol use: Yes    Comment: occ  . Drug use: No  . Sexual activity: Not on file

## 2018-11-17 IMAGING — MR MR ANKLE*R* W/O CM
5 series · 36 of 40 positions shown · non-contrast
Comparison: None.

CLINICAL DATA: Right ankle pain after twisting injury a month ago.

EXAM:
MRI OF THE RIGHT ANKLE WITHOUT CONTRAST
TECHNIQUE: Multiplanar, multisequence MR imaging of the ankle was performed. No
intravenous contrast was administered.

[Series 3: T2 fat-sat · axial · 3.0mm · 0.50mm/px · z∈[-116,+16]mm · 8 of 35 slices shown (1 of 2)]
[im 1/35]
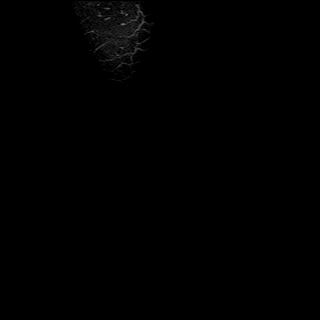
[im 4/35]
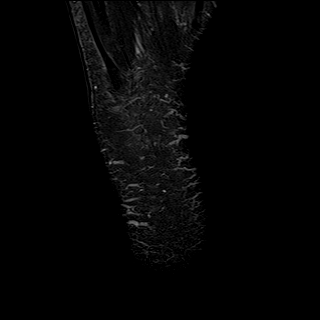
[im 12/35]
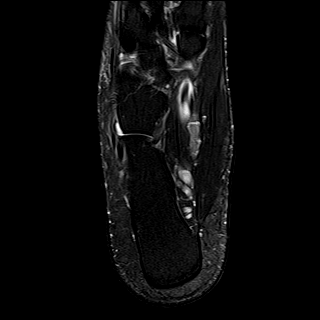
[im 16/35]
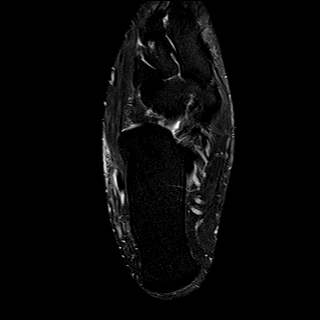
[im 19/35]
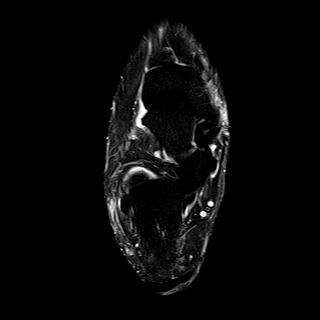
[im 23/35]
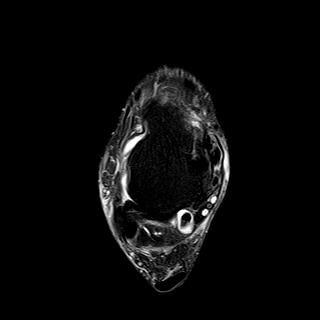
[im 31/35]
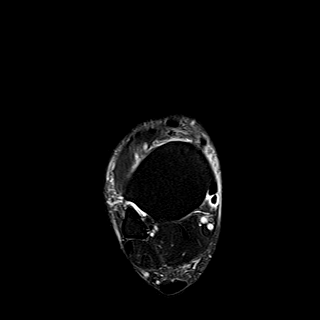
[im 35/35]
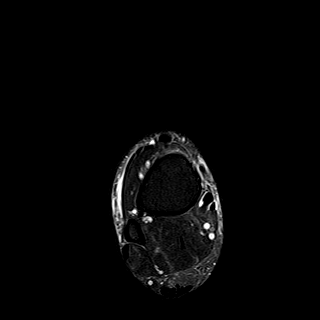

[Series 4: PD fat-sat · axial · 3.0mm · 0.42mm/px · z∈[-116,+16]mm · 10 of 35 slices shown]
[im 1/35]
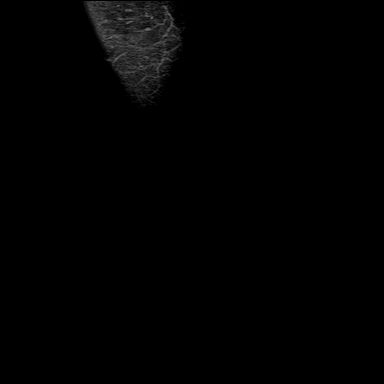
[im 4/35]
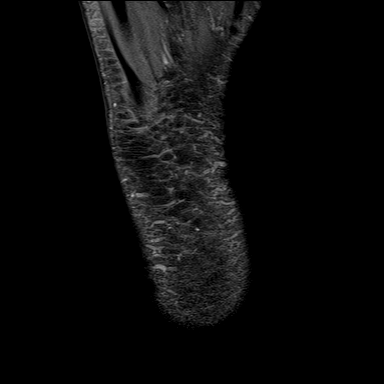
[im 8/35]
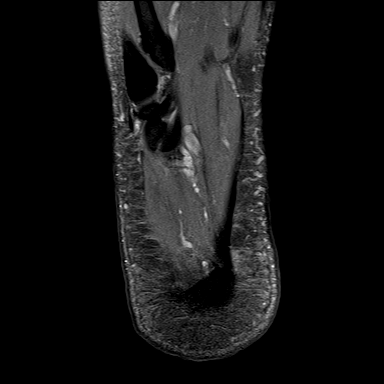
[im 12/35]
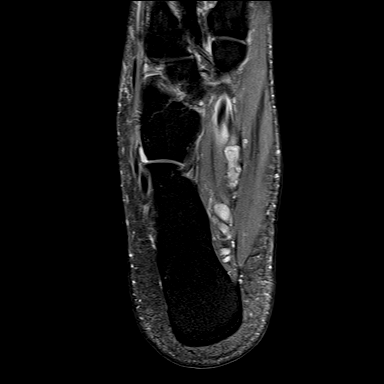
[im 16/35]
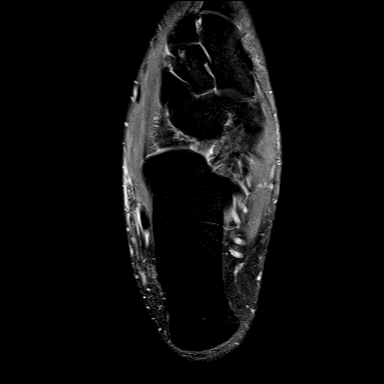
[im 19/35]
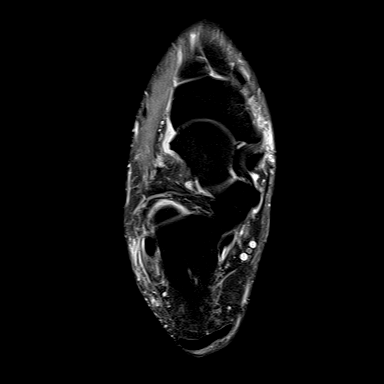
[im 23/35]
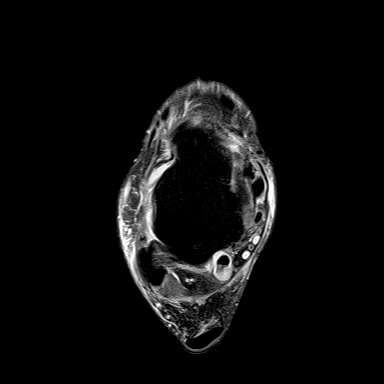
[im 27/35]
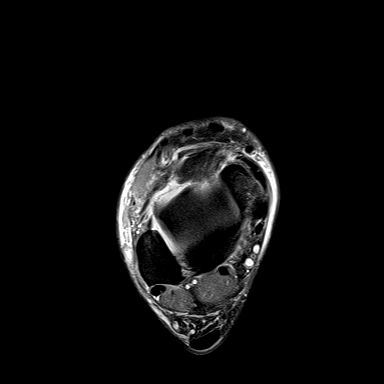
[im 31/35]
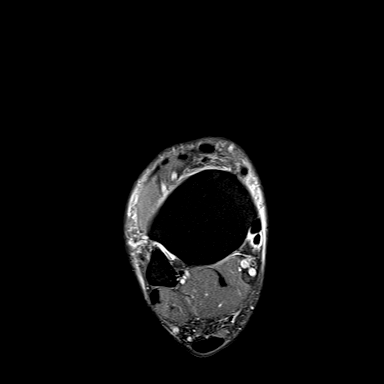
[im 35/35]
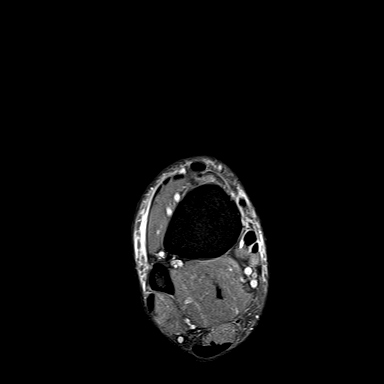

[Series 5: T1 · sagittal · 4.0mm · 0.56mm/px · 5 of 16 slices shown]
[im 1/16]
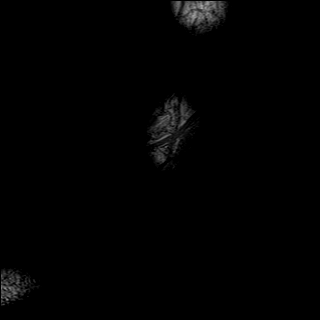
[im 4/16]
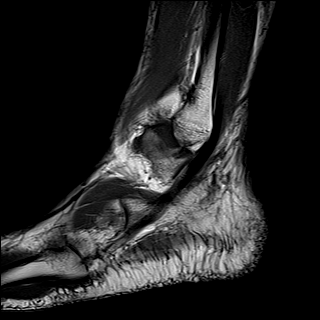
[im 8/16]
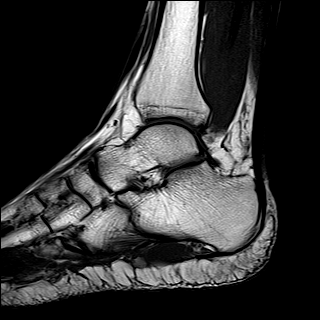
[im 12/16]
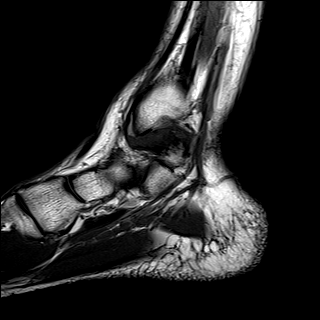
[im 16/16]
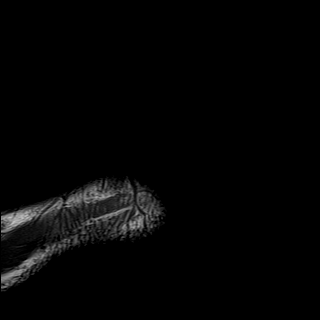

[Series 6: STIR · sagittal · 4.0mm · 0.35mm/px · 5 of 18 slices shown]
[im 1/18]
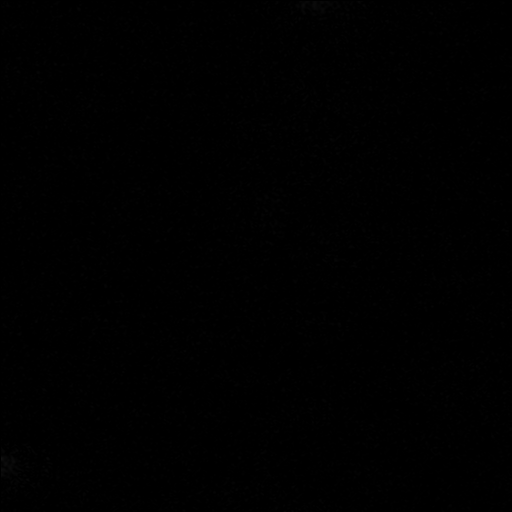
[im 5/18]
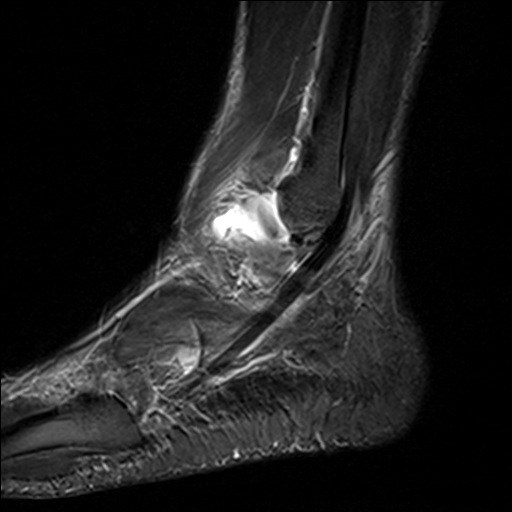
[im 9/18]
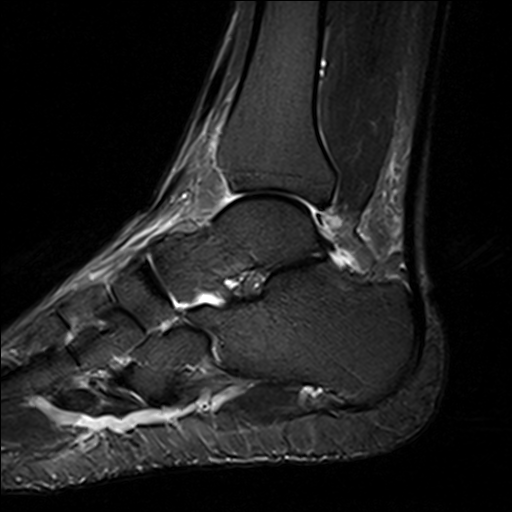
[im 13/18]
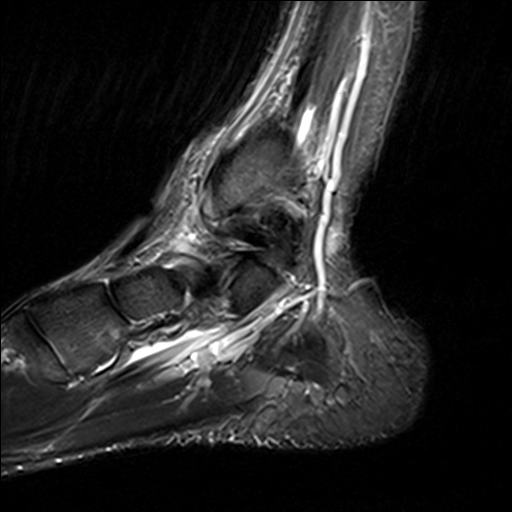
[im 18/18]
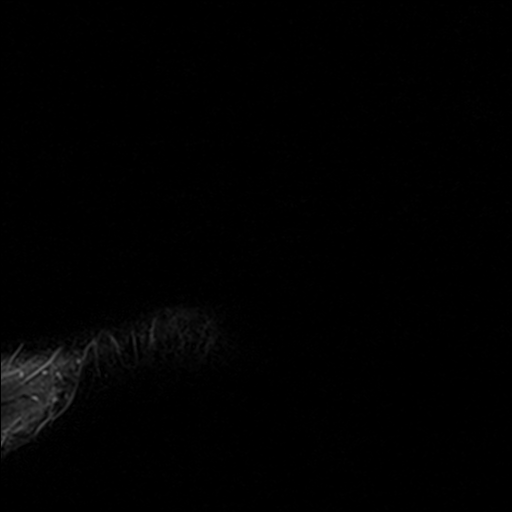

[Series 7: T2 fat-sat · coronal · 3.0mm · 0.50mm/px · 8 of 35 slices shown (2 of 2)]
[im 1/35]
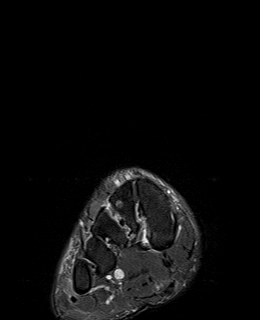
[im 4/35]
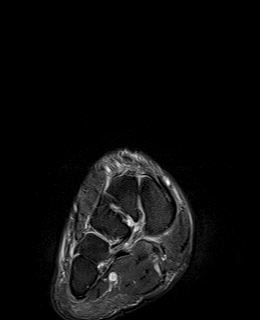
[im 12/35]
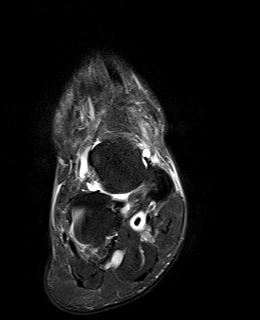
[im 16/35]
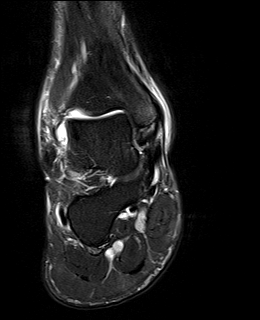
[im 19/35]
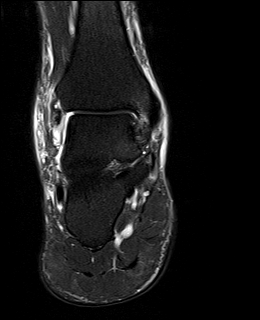
[im 23/35]
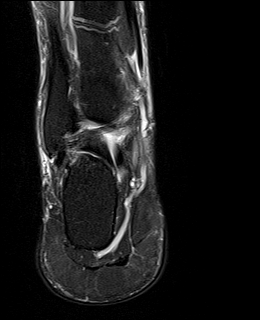
[im 31/35]
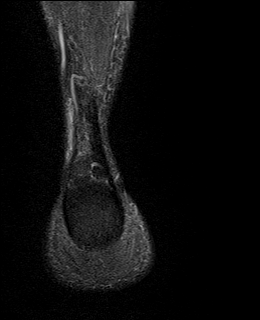
[im 35/35]
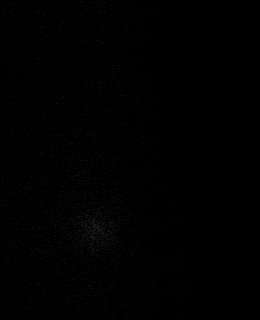

[36 of 40 positions shown; findings below may reference images not displayed]

FINDINGS: TENDONS

Peroneal: Peroneal longus tendon intact. Peroneal brevis intact.
Small amount of fluid in the peroneal tendon sheaths.

Posteromedial: Posterior tibial tendon intact. Flexor hallucis
longus tendon intact. Flexor digitorum longus tendon intact. Small
amount of fluid in the flexor tendon sheaths.

Anterior: Tibialis anterior tendon intact. Extensor hallucis longus
tendon intact Extensor digitorum longus tendon intact.

Achilles:  Intact.

Plantar Fascia: Intact.

LIGAMENTS

Lateral: Anterior talofibular ligament is completely torn.
Calcaneofibular ligament is completely torn near its calcaneal
attachment. Posterior talofibular ligament intact. Anterior and
posterior tibiofibular ligaments intact.

Medial: Deltoid ligament intact. Spring ligament intact. Lisfranc
ligament intact.

CARTILAGE

Ankle Joint: Small joint effusion laterally. Normal ankle mortise.
No chondral defect.

Subtalar Joints/Sinus Tarsi: Normal subtalar joints. No subtalar
joint effusion. Normal sinus tarsi.

Bones: Marrow edema in the medial malleolus. No fracture or
dislocation.

Soft Tissue: Mild bimalleolar soft tissue swelling. No soft tissue
mass or fluid collection.
IMPRESSION: 1. Torn anterior talofibular and calcaneofibular ligaments.
2. Mild contusion in the medial malleolus.  No fracture.
3. Mild flexor and peroneal tenosynovitis.

## 2018-11-29 ENCOUNTER — Encounter: Payer: Self-pay | Admitting: Gastroenterology

## 2018-12-17 ENCOUNTER — Other Ambulatory Visit: Payer: Self-pay

## 2018-12-17 ENCOUNTER — Ambulatory Visit: Payer: BC Managed Care – PPO | Admitting: *Deleted

## 2018-12-17 VITALS — Ht 70.0 in | Wt 175.0 lb

## 2018-12-17 DIAGNOSIS — Z1211 Encounter for screening for malignant neoplasm of colon: Secondary | ICD-10-CM

## 2018-12-17 MED ORDER — PEG 3350-KCL-NA BICARB-NACL 420 G PO SOLR: 4000.0000 mL | Freq: Once | ORAL | 0 refills | Status: AC

## 2018-12-17 NOTE — Progress Notes (Signed)
Patient's pre-visit was done today over the phone with the patient due to COVID-19 pandemic. Name,DOB and address verified. Insurance verified. Packet of Prep instructions mailed to patient including copy of a consent form and pre-procedure patient acknowledgement form-pt is aware. Patient understands to call us back with any questions or concerns.   Patient denies any allergies to eggs or soy. Patient denies any problems with anesthesia/sedation. Patient denies any oxygen use at home. Patient denies taking any diet/weight loss medications or blood thinners. EMMI education assisgned to patient on colonoscopy, this was explained and instructions given to patient.  Pt is aware that care partner will wait in the car during parking lot; if they feel like they will be too hot to wait in the car; they may wait in the lobby.  We want them to wear a mask (we do not have any that we can provide them), practice social distancing, and we will check their temperatures when they get here.  I did remind patient that their care partner needs to stay in the parking lot the entire time. Pt will wear mask into building 

## 2018-12-27 ENCOUNTER — Telehealth: Payer: Self-pay | Admitting: Gastroenterology

## 2018-12-27 NOTE — Telephone Encounter (Signed)
Spoke w/patient regarding Covid-19 screening questions Covid-19 Screening Questions:  Do you now or have you had a fever in the last 14 days? no  Do you have any respiratory symptoms of shortness of breath or cough now or in the last 14 days? no  Do you have any family members or close contacts with diagnosed or suspected Covid-19 in the past 14 days? No  Have you been tested for Covid-19 and found to be positive? no  Pt made aware of that care partner may wait in the car or come up to the lobby during the procedure but will need to provide their own mask.

## 2018-12-28 ENCOUNTER — Ambulatory Visit (AMBULATORY_SURGERY_CENTER): Payer: BC Managed Care – PPO | Admitting: Gastroenterology

## 2018-12-28 ENCOUNTER — Encounter: Payer: Self-pay | Admitting: Gastroenterology

## 2018-12-28 ENCOUNTER — Other Ambulatory Visit: Payer: Self-pay

## 2018-12-28 VITALS — BP 113/73 | HR 60 | Temp 98.7°F | Resp 14 | Ht 70.0 in | Wt 175.0 lb

## 2018-12-28 DIAGNOSIS — D123 Benign neoplasm of transverse colon: Secondary | ICD-10-CM | POA: Diagnosis not present

## 2018-12-28 DIAGNOSIS — Z1211 Encounter for screening for malignant neoplasm of colon: Secondary | ICD-10-CM

## 2018-12-28 DIAGNOSIS — D122 Benign neoplasm of ascending colon: Secondary | ICD-10-CM

## 2018-12-28 MED ORDER — SODIUM CHLORIDE 0.9 % IV SOLN: 500.0000 mL | Freq: Once | INTRAVENOUS | Status: DC

## 2018-12-28 NOTE — Progress Notes (Signed)
Called to room to assist during endoscopic procedure.  Patient ID and intended procedure confirmed with present staff. Received instructions for my participation in the procedure from the performing physician.  

## 2018-12-28 NOTE — Progress Notes (Signed)
PT taken to PACU. Monitors in place. VSS. Report given to RN. 

## 2018-12-28 NOTE — Op Note (Signed)
[facility] Patient Name: Daniel Caldwell Procedure Date: 12/28/2018 1:25 PM MRN: [id number] Endoscopist: Milus Banister , MD Age: 53 Referring MD:  Date of Birth: 04/02/1966 Gender: Male Account #: [id number] Procedure:                Colonoscopy Indications:              Screening for colorectal malignant neoplasm Medicines:                Monitored Anesthesia Care Procedure:                Pre-Anesthesia Assessment:                           - Prior to the procedure, a History and Physical                            was performed, and patient medications and                            allergies were reviewed. The patient's tolerance of                            previous anesthesia was also reviewed. The risks                            and benefits of the procedure and the sedation                            options and risks were discussed with the patient.                            All questions were answered, and informed consent                            was obtained. Prior Anticoagulants: The patient has                            taken no previous anticoagulant or antiplatelet                            agents. ASA Grade Assessment: II - A patient with                            mild systemic disease. After reviewing the risks                            and benefits, the patient was deemed in                            satisfactory condition to undergo the procedure.                           After obtaining informed consent, the colonoscope  was passed under direct vision. Throughout the                            procedure, the patient's blood pressure, pulse, and                            oxygen saturations were monitored continuously. The                            Colonoscope was introduced through the anus and                            advanced to the the cecum, identified by                            appendiceal orifice and  ileocecal valve. The                            colonoscopy was performed without difficulty. The                            patient tolerated the procedure well. The quality                            of the bowel preparation was good. The ileocecal                            valve, appendiceal orifice, and rectum were                            photographed. Scope In: 1:30:25 PM Scope Out: 1:44:39 PM Scope Withdrawal Time: 0 hours 10 minutes 57 seconds  Total Procedure Duration: 0 hours 14 minutes 14 seconds  Findings:                 Three sessile polyps were found in the transverse                            colon and ascending colon. The polyps were 3 to 5                            mm in size. These polyps were removed with a cold                            snare. Resection and retrieval were complete.                           The exam was otherwise without abnormality on                            direct and retroflexion views. Complications:            No immediate complications. Estimated blood loss:  None. Estimated Blood Loss:     Estimated blood loss: none. Impression:               - Three 3 to 5 mm polyps in the transverse colon                            and in the ascending colon, removed with a cold                            snare. Resected and retrieved.                           - The examination was otherwise normal on direct                            and retroflexion views. Recommendation:           - Patient has a contact number available for                            emergencies. The signs and symptoms of potential                            delayed complications were discussed with the                            patient. Return to normal activities tomorrow.                            Written discharge instructions were provided to the                            patient.                           - Resume previous diet.                            - Continue present medications.                           You will receive a letter within 2-3 weeks with the                            pathology results and my final recommendations.                           If the polyp(s) is proven to be 'pre-cancerous' on                            pathology, you will need repeat colonoscopy in 3-7                            years. If the polyp(s) is NOT 'precancerous' on  pathology then you should repeat colon cancer                            screening in 10 years with colonoscopy without need                            for colon cancer screening by any method prior to                            then (including stool testing). Milus Banister, MD 12/28/2018 1:46:52 PM This report has been signed electronically.

## 2018-12-28 NOTE — Progress Notes (Signed)
Pt's states no medical or surgical changes since previsit or office visit. 

## 2018-12-28 NOTE — Patient Instructions (Signed)
3 polyps removed Dr Ardis Hughs sent for pathology to the lab He will send you a letter in 1-3 weeks If you do not receive this letter please call us at [phone] and we will give you your results over the phone Please note the 24/7 phone number below circled in red    YOU HAD AN ENDOSCOPIC PROCEDURE TODAY AT [facility]:   Refer to the procedure report that was given to you for any specific questions about what was found during the examination.  If the procedure report does not answer your questions, please call your gastroenterologist to clarify.  If you requested that your care partner not be given the details of your procedure findings, then the procedure report has been included in a sealed envelope for you to review at your convenience later.  YOU SHOULD EXPECT: Some feelings of bloating in the abdomen. Passage of more gas than usual.  Walking can help get rid of the air that was put into your GI tract during the procedure and reduce the bloating. If you had a lower endoscopy (such as a colonoscopy or flexible sigmoidoscopy) you may notice spotting of blood in your stool or on the toilet paper. If you underwent a bowel prep for your procedure, you may not have a normal bowel movement for a few days.  Please Note:  You might notice some irritation and congestion in your nose or some drainage.  This is from the oxygen used during your procedure.  There is no need for concern and it should clear up in a day or so.  SYMPTOMS TO REPORT IMMEDIATELY:   Following lower endoscopy (colonoscopy or flexible sigmoidoscopy):  Excessive amounts of blood in the stool  Significant tenderness or worsening of abdominal pains  Swelling of the abdomen that is new, acute  Fever of 100F or higher  For urgent or emergent issues, a gastroenterologist can be reached at any hour by calling [phone].   DIET:  We do recommend a small meal at first, but then you may proceed to your regular  diet.  Drink plenty of fluids but you should avoid alcoholic beverages for 24 hours.  ACTIVITY:  You should plan to take it easy for the rest of today and you should NOT DRIVE or use heavy machinery until tomorrow (because of the sedation medicines used during the test).    FOLLOW UP: Our staff will call the number listed on your records 48-72 hours following your procedure to check on you and address any questions or concerns that you may have regarding the information given to you following your procedure. If we do not reach you, we will leave a message.  We will attempt to reach you two times.  During this call, we will ask if you have developed any symptoms of COVID 19. If you develop any symptoms (ie: fever, flu-like symptoms, shortness of breath, cough etc.) before then, please call [phone].  If you test positive for Covid 19 in the 2 weeks post procedure, please call and report this information to [place].    If any biopsies were taken you will be contacted by phone or by letter within the next 1-3 weeks.  Please call us at [phone] if you have not heard about the biopsies in 3 weeks.    SIGNATURES/CONFIDENTIALITY: You and/or your care partner have signed paperwork which will be entered into your electronic medical record.  These signatures attest to the fact that that the information above  on your After Visit Summary has been reviewed and is understood.  Full responsibility of the confidentiality of this discharge information lies with you and/or your care-partner.

## 2019-01-01 ENCOUNTER — Telehealth: Payer: Self-pay | Admitting: *Deleted

## 2019-01-01 ENCOUNTER — Telehealth: Payer: Self-pay

## 2019-01-01 NOTE — Telephone Encounter (Signed)
Follow up call made, left a voicemail. 

## 2019-01-01 NOTE — Telephone Encounter (Signed)
  Follow up Call-  Call back number 12/28/2018  Post procedure Call Back phone  # [phone]  Permission to leave phone message Yes  Some recent data might be hidden    LMOM to call back with any questions or yes to the following-  1. Have you developed a fever since your procedure?   2.   Have you had an respiratory symptoms (SOB or cough) since your procedure?  3.   Have you tested positive for COVID 19 since your procedure   4.   Have you had any family members/close contacts diagnosed with the COVID 19 since your procedure?     If yes to any of these questions please route to Joylene John, RN and Alphonsa Gin, RN.

## 2019-01-03 ENCOUNTER — Encounter: Payer: Self-pay | Admitting: Gastroenterology

## 2019-08-14 ENCOUNTER — Other Ambulatory Visit: Payer: Self-pay

## 2019-08-14 ENCOUNTER — Ambulatory Visit (INDEPENDENT_AMBULATORY_CARE_PROVIDER_SITE_OTHER): Payer: BC Managed Care – PPO | Admitting: Orthopedic Surgery

## 2019-08-14 ENCOUNTER — Encounter: Payer: Self-pay | Admitting: Orthopedic Surgery

## 2019-08-14 ENCOUNTER — Ambulatory Visit: Payer: Self-pay

## 2019-08-14 DIAGNOSIS — M25562 Pain in left knee: Secondary | ICD-10-CM | POA: Diagnosis not present

## 2019-08-14 DIAGNOSIS — M1712 Unilateral primary osteoarthritis, left knee: Secondary | ICD-10-CM

## 2019-08-14 MED ORDER — OXYCODONE-ACETAMINOPHEN 5-325 MG PO TABS: ORAL_TABLET | ORAL | 0 refills | Status: DC

## 2019-08-14 MED ORDER — METHYLPREDNISOLONE ACETATE 40 MG/ML IJ SUSP
40.0000 mg | INTRAMUSCULAR | Status: AC | PRN
  Administered 2019-08-14: 40 mg via INTRA_ARTICULAR

## 2019-08-14 MED ORDER — LIDOCAINE HCL 1 % IJ SOLN
5.0000 mL | INTRAMUSCULAR | Status: AC | PRN
  Administered 2019-08-14: 5 mL

## 2019-08-14 MED ORDER — BUPIVACAINE HCL 0.25 % IJ SOLN
4.0000 mL | INTRAMUSCULAR | Status: AC | PRN
  Administered 2019-08-14: 4 mL via INTRA_ARTICULAR

## 2019-08-14 NOTE — Progress Notes (Signed)
Office Visit Note   Patient: Daniel Caldwell           Date of Birth: 1965-10-29           MRN: [id number] Visit Date: 08/14/2019 Requested by: Daniel Caldwell, [place] [address] PCP: Daniel Hong, MD  Subjective: Chief Complaint  Patient presents with  . Left Knee - Pain    HPI: Daniel Caldwell is a patient with left knee pain.  Had fairly large osteochondral fragment excised from the lateral condyle about 5 years ago.  He does pretty demanding work at [employer].  Takes oxycodone as needed but not every day.  He is taking care of his father currently and cannot undergo knee replacement due to that.              ROS: All systems reviewed are negative as they relate to the chief complaint within the history of present illness.  Patient denies  fevers or chills.   Assessment & Plan: Visit Diagnoses:  1. Left knee pain, unspecified chronicity   2. Unilateral primary osteoarthritis, left knee     Plan: Impression is left knee arthritis.  Plan is left knee aspiration injection today.  Actually there is not too much to be aspirated so is really just an injection.  Overall radiographs do show arthritis but not severe as I would have expected at this time.  Also encouraging is absence of effusion today.  Follow-up as needed.  Follow-Up Instructions: Return if symptoms worsen or fail to improve.   Orders:  Orders Placed This Encounter  Procedures  . XR KNEE 3 VIEW LEFT   Meds ordered this encounter  Medications  . DISCONTD: oxyCODONE-acetaminophen (PERCOCET/ROXICET) 5-325 MG tablet    Sig: 1 po q hs prn pain    Dispense:  30 tablet    Refill:  0  . oxyCODONE-acetaminophen (PERCOCET/ROXICET) 5-325 MG tablet    Sig: 1 po q hs prn pain    Dispense:  30 tablet    Refill:  0      Procedures: Large Joint Inj: L knee on 08/14/2019 12:22 PM Indications: diagnostic evaluation, joint swelling and pain Details: 18 G 1.5 in needle, superolateral approach  Arthrogram:  No  Medications: 5 mL lidocaine 1 %; 40 mg methylPREDNISolone acetate 40 MG/ML; 4 mL bupivacaine 0.25 % Outcome: tolerated well, no immediate complications Procedure, treatment alternatives, risks and benefits explained, specific risks discussed. Consent was given by the patient. Immediately prior to procedure a time out was called to verify the correct patient, procedure, equipment, support staff and site/side marked as required. Patient was prepped and draped in the usual sterile fashion.       Clinical Data: No additional findings.  Objective: Vital Signs: There were no vitals taken for this visit.  Physical Exam:   Constitutional: Patient appears well-developed HEENT:  Head: Normocephalic Eyes:EOM are normal Neck: Normal range of motion Cardiovascular: Normal rate Pulmonary/chest: Effort normal Neurologic: Patient is alert Skin: Skin is warm Psychiatric: Patient has normal mood and affect    Ortho Exam: Ortho exam demonstrates about a 5 degree flexion contracture left knee compared to the right.  No effusion in the left knee today.  Collateral and cruciate ligaments are stable.  No masses lymphadenopathy or skin changes noted in that left knee region.  Range of motion he has about 115 of flexion.  Specialty Comments:  No specialty comments available.  Imaging: XR KNEE 3 VIEW LEFT  Result Date: 08/14/2019  AP lateral merchant left knee reviewed.  Tricompartmental degenerative changes noted slightly worse in the lateral compartment.  Spurring is noted on the femoral side in this region.  No loose bodies.  Patellofemoral joint space relatively well-maintained.    PMFS History: Patient Active Problem List   Diagnosis Date Noted  . Recurrent right inguinal hernia 01/22/2018  . Left inguinal hernia 01/22/2018   Past Medical History:  Diagnosis Date  . Left inguinal hernia 01/22/2018  . Recurrent right inguinal hernia 01/22/2018  . Sleep apnea    uses CPAP    Family  History  Problem Relation Age of Onset  . Colon cancer Neg Hx   . Colon polyps Neg Hx   . Esophageal cancer Neg Hx   . Rectal cancer Neg Hx   . Stomach cancer Neg Hx     Past Surgical History:  Procedure Laterality Date  . INGUINAL HERNIA REPAIR Bilateral 01/22/2018   Procedure: OPEN BILATERAL INGUINAL HERNIA REPAIR ERAS PATHWAY;  Surgeon: Fanny Skates, MD;  Location: [place];  Service: General;  Laterality: Bilateral;  . INSERTION OF MESH Bilateral 01/22/2018   Procedure: INSERTION OF MESH;  Surgeon: Fanny Skates, MD;  Location: [place];  Service: General;  Laterality: Bilateral;  . KNEE SURGERY     Social History   Occupational History  . Not on file  Tobacco Use  . Smoking status: Never Smoker  . Smokeless tobacco: Former Systems developer    Types: Chew  Substance and Sexual Activity  . Alcohol use: Yes    Alcohol/week: 3.0 standard drinks    Types: 3 Cans of beer per week  . Drug use: No  . Sexual activity: Not on file

## 2019-08-20 ENCOUNTER — Telehealth: Payer: Self-pay | Admitting: Orthopedic Surgery

## 2019-08-20 NOTE — Telephone Encounter (Signed)
Pt called stated went to pick up Rx and was told it had to have prior auth to it before he could get medication.  Please call [phone]

## 2019-08-28 ENCOUNTER — Other Ambulatory Visit: Payer: Self-pay | Admitting: Surgical

## 2019-08-28 ENCOUNTER — Telehealth: Payer: Self-pay | Admitting: Orthopedic Surgery

## 2019-08-28 MED ORDER — OXYCODONE-ACETAMINOPHEN 5-325 MG PO TABS: ORAL_TABLET | ORAL | 0 refills | Status: DC

## 2019-08-28 NOTE — Telephone Encounter (Signed)
Pt called in again in regards to his medication Oxycodone, said he tried to go pick it up at the pharmacy but they are requiring prior authorization.    [phone]

## 2019-08-28 NOTE — Telephone Encounter (Signed)
See other note

## 2019-08-28 NOTE — Telephone Encounter (Signed)
I spoke with patient as well as pharmacist. Patients insurance will not cover any narcotics for more than a week supply. Can you please change sig and resubmit? Thanks.

## 2019-08-29 NOTE — Telephone Encounter (Signed)
IC s/w patient and advised  

## 2019-09-05 ENCOUNTER — Ambulatory Visit: Payer: BC Managed Care – PPO | Attending: Internal Medicine

## 2019-09-05 DIAGNOSIS — Z23 Encounter for immunization: Secondary | ICD-10-CM

## 2019-09-05 NOTE — Progress Notes (Signed)
   Covid-19 Vaccination Clinic  Name:  TRILLION RENICKER    MRN: [id number] DOB: [birth year]  09/05/2019  Mr. Masser was observed post Covid-19 immunization for 15 minutes without incident. He was provided with Vaccine Information Sheet and instruction to access the V-Safe system.   Mr. Selz was instructed to call 911 with any severe reactions post vaccine: Marland Kitchen Difficulty breathing  . Swelling of face and throat  . A fast heartbeat  . A bad rash all over body  . Dizziness and weakness   Immunizations Administered    Name Date Dose VIS Date Route   Moderna COVID-19 Vaccine 09/05/2019  9:11 AM 0.5 mL 06/04/2019 Intramuscular   Manufacturer: Moderna   Lot: [id number]   [place]:[id number]

## 2019-10-08 ENCOUNTER — Ambulatory Visit: Payer: BC Managed Care – PPO | Attending: Internal Medicine

## 2019-10-08 DIAGNOSIS — Z23 Encounter for immunization: Secondary | ICD-10-CM

## 2019-10-08 NOTE — Progress Notes (Signed)
   Covid-19 Vaccination Clinic  Name:  Daniel Caldwell    MRN: [id number] DOB: Jun 11, 1966  10/08/2019  Mr. [name] was observed post Covid-19 immunization for 15 minutes without incident. He was provided with Vaccine Information Sheet and instruction to access the V-Safe system.   Mr. Las was instructed to call 911 with any severe reactions post vaccine: Marland Kitchen Difficulty breathing  . Swelling of face and throat  . A fast heartbeat  . A bad rash all over body  . Dizziness and weakness   Immunizations Administered    Name Date Dose VIS Date Route   Moderna COVID-19 Vaccine 10/08/2019  8:42 AM 0.5 mL 06/04/2019 Intramuscular   Manufacturer: Moderna   LotMV:[id number]   [place]BE:[id number]

## 2019-11-29 ENCOUNTER — Encounter: Payer: Self-pay | Admitting: Orthopedic Surgery

## 2019-11-29 ENCOUNTER — Other Ambulatory Visit: Payer: Self-pay

## 2019-11-29 ENCOUNTER — Ambulatory Visit (INDEPENDENT_AMBULATORY_CARE_PROVIDER_SITE_OTHER): Payer: BC Managed Care – PPO | Admitting: Orthopedic Surgery

## 2019-11-29 VITALS — Ht 70.0 in | Wt 170.0 lb

## 2019-11-29 DIAGNOSIS — M1712 Unilateral primary osteoarthritis, left knee: Secondary | ICD-10-CM

## 2019-11-30 MED ORDER — OXYCODONE HCL 5 MG PO CAPS: 5.0000 mg | ORAL_CAPSULE | Freq: Every day | ORAL | 0 refills | Status: DC | PRN

## 2019-12-01 ENCOUNTER — Encounter: Payer: Self-pay | Admitting: Orthopedic Surgery

## 2019-12-01 DIAGNOSIS — M1712 Unilateral primary osteoarthritis, left knee: Secondary | ICD-10-CM | POA: Diagnosis not present

## 2019-12-01 MED ORDER — BUPIVACAINE HCL 0.25 % IJ SOLN
4.0000 mL | INTRAMUSCULAR | Status: AC | PRN
  Administered 2019-12-01: 4 mL via INTRA_ARTICULAR

## 2019-12-01 MED ORDER — LIDOCAINE HCL 1 % IJ SOLN
5.0000 mL | INTRAMUSCULAR | Status: AC | PRN
  Administered 2019-12-01: 5 mL

## 2019-12-01 MED ORDER — METHYLPREDNISOLONE ACETATE 40 MG/ML IJ SUSP
40.0000 mg | INTRAMUSCULAR | Status: AC | PRN
  Administered 2019-12-01: 40 mg via INTRA_ARTICULAR

## 2019-12-01 NOTE — Progress Notes (Signed)
Office Visit Note   Patient: Daniel Caldwell           Date of Birth: 10/04/1965           MRN: [id number] Visit Date: 11/29/2019 Requested by: Eber Hong, [place] [address] PCP: Eber Hong, MD  Subjective: Chief Complaint  Patient presents with  . Left Knee - Pain    HPI: Daniel Caldwell is a 54 year old patient with left knee arthritis.  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  Forms are filled out today to continue his current work status.  He may need to have episodic breaks due to the fairly severe arthritis in the left knee.  He is taking Norco but only occasionally and only at night.  He has had no problems in the past with sleep apnea and this medication.  Has some stomach issues with anti-inflammatories at times.  Does have some night pain and rest pain with this left knee.  Denies any mechanical symptoms.              ROS: All systems reviewed are negative as they relate to the chief complaint within the history of present illness.  Patient denies  fevers or chills.   Assessment & Plan: Visit Diagnoses:  1. Unilateral primary osteoarthritis, left knee     Plan: Impression is left knee pain with pretty severe arthritis in that lateral compartment.  Plan is cortisone injection today per patient request.  Gel injections worked well for the patient but his insurance will not approve them anymore.  Continue with nonweightbearing quad strengthening exercises and follow-up with me as needed.  Follow-Up Instructions: Return if symptoms worsen or fail to improve.   Orders:  No orders of the defined types were placed in this encounter.  Meds ordered this encounter  Medications  . oxycodone (OXY-IR) 5 MG capsule    Sig: Take 1 capsule (5 mg total) by mouth daily as needed.    Dispense:  28 capsule    Refill:  0      Procedures: Large Joint Inj: L knee on 12/01/2019 9:30 PM Indications: diagnostic evaluation, joint swelling and pain Details:  18 G 1.5 in needle, superolateral approach  Arthrogram: No  Medications: 5 mL lidocaine 1 %; 40 mg methylPREDNISolone acetate 40 MG/ML; 4 mL bupivacaine 0.25 % Outcome: tolerated well, no immediate complications Procedure, treatment alternatives, risks and benefits explained, specific risks discussed. Consent was given by the patient. Immediately prior to procedure a time out was called to verify the correct patient, procedure, equipment, support staff and site/side marked as required. Patient was prepped and draped in the usual sterile fashion.       Clinical Data: No additional findings.  Objective: Vital Signs: Ht 5\' 10"  (1.778 m)   Wt 170 lb (77.1 kg)   BMI 24.39 kg/m   Physical Exam:   Constitutional: Patient appears well-developed HEENT:  Head: Normocephalic Eyes:EOM are normal Neck: Normal range of motion Cardiovascular: Normal rate Pulmonary/chest: Effort normal Neurologic: Patient is alert Skin: Skin is warm Psychiatric: Patient has normal mood and affect    Ortho Exam: Ortho exam demonstrates antalgic gait to the left with about a 5 degree flexion contracture.  No effusion in the knee.  Extensor mechanism is intact.  6 neutral to slight valgus alignment.  Collateral and cruciate ligaments are stable.  No other masses lymphadenopathy or skin changes noted in that left knee region.  He is able to flex  to about 105 degrees.  Specialty Comments:  No specialty comments available.  Imaging: No results found.   PMFS History: Patient Active Problem List   Diagnosis Date Noted  . Recurrent right inguinal hernia 01/22/2018  . Left inguinal hernia 01/22/2018   Past Medical History:  Diagnosis Date  . Left inguinal hernia 01/22/2018  . Recurrent right inguinal hernia 01/22/2018  . Sleep apnea    uses CPAP    Family History  Problem Relation Age of Onset  . Colon cancer Neg Hx   . Colon polyps Neg Hx   . Esophageal cancer Neg Hx   . Rectal cancer Neg Hx   .  Stomach cancer Neg Hx     Past Surgical History:  Procedure Laterality Date  . INGUINAL HERNIA REPAIR Bilateral 01/22/2018   Procedure: OPEN BILATERAL INGUINAL HERNIA REPAIR ERAS PATHWAY;  Surgeon: Fanny Skates, MD;  Location: [place];  Service: General;  Laterality: Bilateral;  . INSERTION OF MESH Bilateral 01/22/2018   Procedure: INSERTION OF MESH;  Surgeon: Fanny Skates, MD;  Location: [facility];  Service: General;  Laterality: Bilateral;  . KNEE SURGERY     Social History   Occupational History  . Not on file  Tobacco Use  . Smoking status: Never Smoker  . Smokeless tobacco: Former Systems developer    Types: Chew  Substance and Sexual Activity  . Alcohol use: Yes    Alcohol/week: 3.0 standard drinks    Types: 3 Cans of beer per week  . Drug use: No  . Sexual activity: Not on file

## 2019-12-09 ENCOUNTER — Telehealth: Payer: Self-pay | Admitting: Orthopedic Surgery

## 2019-12-09 NOTE — Telephone Encounter (Signed)
I spoke with patient. He wanted to clairfy his intermittent leave. I called and discussed forms.

## 2019-12-09 NOTE — Telephone Encounter (Signed)
Patient called.   He is requesting a call back to raise some questions about his work restrictions.   Call back:  [phone]

## 2020-09-04 ENCOUNTER — Other Ambulatory Visit: Payer: Self-pay | Admitting: Surgical

## 2020-09-04 ENCOUNTER — Ambulatory Visit (INDEPENDENT_AMBULATORY_CARE_PROVIDER_SITE_OTHER): Payer: BC Managed Care – PPO | Admitting: Surgical

## 2020-09-04 DIAGNOSIS — M1712 Unilateral primary osteoarthritis, left knee: Secondary | ICD-10-CM

## 2020-09-04 MED ORDER — OXYCODONE HCL 5 MG PO CAPS: 5.0000 mg | ORAL_CAPSULE | Freq: Every evening | ORAL | 0 refills | Status: DC | PRN

## 2020-09-06 ENCOUNTER — Encounter: Payer: Self-pay | Admitting: Surgical

## 2020-09-06 DIAGNOSIS — M1712 Unilateral primary osteoarthritis, left knee: Secondary | ICD-10-CM

## 2020-09-06 MED ORDER — BUPIVACAINE HCL 0.25 % IJ SOLN
4.0000 mL | INTRAMUSCULAR | Status: AC | PRN
Start: 2020-09-06 — End: 2020-09-06
  Administered 2020-09-06: 4 mL via INTRA_ARTICULAR

## 2020-09-06 MED ORDER — LIDOCAINE HCL 1 % IJ SOLN
5.0000 mL | INTRAMUSCULAR | Status: AC | PRN
  Administered 2020-09-06: 5 mL

## 2020-09-06 MED ORDER — BETAMETHASONE SOD PHOS & ACET 6 (3-3) MG/ML IJ SUSP
6.0000 mg | INTRAMUSCULAR | Status: AC | PRN
  Administered 2020-09-06: 6 mg via INTRA_ARTICULAR

## 2020-09-06 NOTE — Progress Notes (Signed)
Office Visit Note   Patient: Daniel Caldwell           Date of Birth: May 04, 1966           MRN: [id number] Visit Date: 09/04/2020 Requested by: Eber Hong, [place] [address] PCP: Eber Hong, MD  Subjective: Chief Complaint  Patient presents with  . Left Knee - Pain    HPI: Daniel Caldwell is a 55 y.o. male who presents to the office complaining of left knee pain.  Patient has history of left knee osteoarthritis.  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  He states he does okay with his knee unless he is at work.  Last injection lasted several months.  Localizes most of his pain to the lateral aspect of the left knee.  Denies any radicular pain or groin pain.  He does have some occasional numbness and tingling but only when he is working and it is not a big concern for him.  He does occasionally wake with pain.  Denies any history of diabetes.  He has about 30-minute walking endurance.  Does have prior MCL repair of the left knee..                ROS: All systems reviewed are negative as they relate to the chief complaint within the history of present illness.  Patient denies fevers or chills.  Assessment & Plan: Visit Diagnoses:  1. Unilateral primary osteoarthritis, left knee     Plan: Patient is a 55 year old male who presents complaining of left knee pain.  Has history of left knee osteoarthritis.  Did well with his last injection that lasted several months.  He is working at [employer] and this is a physical job that worsens his knee pain when he is working.  Plan to administer left knee injection today.  Patient tolerated the procedure well.  Plan to follow-up as needed.  He has severe pain that wakes him up at night on occasion so prescribed pain medication to take only at night as needed.  He understands.  Follow-Up Instructions: No follow-ups on file.   Orders:  No orders of the defined types were placed in this  encounter.  No orders of the defined types were placed in this encounter.     Procedures: Large Joint Inj: L knee on 09/06/2020 12:15 PM Indications: diagnostic evaluation, joint swelling and pain Details: 18 G 1.5 in needle, superolateral approach  Arthrogram: No  Medications: 5 mL lidocaine 1 %; 4 mL bupivacaine 0.25 %; 6 mg betamethasone acetate-betamethasone sodium phosphate 6 (3-3) MG/ML Outcome: tolerated well, no immediate complications Procedure, treatment alternatives, risks and benefits explained, specific risks discussed. Consent was given by the patient. Immediately prior to procedure a time out was called to verify the correct patient, procedure, equipment, support staff and site/side marked as required. Patient was prepped and draped in the usual sterile fashion.       Clinical Data: No additional findings.  Objective: Vital Signs: There were no vitals taken for this visit.  Physical Exam:  Constitutional: Patient appears well-developed HEENT:  Head: Normocephalic Eyes:EOM are normal Neck: Normal range of motion Cardiovascular: Normal rate Pulmonary/chest: Effort normal Neurologic: Patient is alert Skin: Skin is warm Psychiatric: Patient has normal mood and affect  Ortho Exam: Ortho exam demonstrates left knee with no effusion.  Tenderness over the lateral joint line.  No tenderness over the medial joint line.  Pain with  hip range of motion.  Negative straight leg raise.  Able to perform straight leg raise with no extensor mechanism lag.  No calf tenderness.  Negative Homans' sign.  Specialty Comments:  No specialty comments available.  Imaging: No results found.   PMFS History: Patient Active Problem List   Diagnosis Date Noted  . Recurrent right inguinal hernia 01/22/2018  . Left inguinal hernia 01/22/2018   Past Medical History:  Diagnosis Date  . Left inguinal hernia 01/22/2018  . Recurrent right inguinal hernia 01/22/2018  . Sleep apnea     uses CPAP    Family History  Problem Relation Age of Onset  . Colon cancer Neg Hx   . Colon polyps Neg Hx   . Esophageal cancer Neg Hx   . Rectal cancer Neg Hx   . Stomach cancer Neg Hx     Past Surgical History:  Procedure Laterality Date  . INGUINAL HERNIA REPAIR Bilateral 01/22/2018   Procedure: OPEN BILATERAL INGUINAL HERNIA REPAIR ERAS PATHWAY;  Surgeon: Fanny Skates, MD;  Location: [facility] [place];  Service: General;  Laterality: Bilateral;  . INSERTION OF MESH Bilateral 01/22/2018   Procedure: INSERTION OF MESH;  Surgeon: Fanny Skates, MD;  Location: [place];  Service: General;  Laterality: Bilateral;  . KNEE SURGERY     Social History   Occupational History  . Not on file  Tobacco Use  . Smoking status: Never Smoker  . Smokeless tobacco: Former Systems developer    Types: Secondary school teacher  . Vaping Use: Never used  Substance and Sexual Activity  . Alcohol use: Yes    Alcohol/week: 3.0 standard drinks    Types: 3 Cans of beer per week  . Drug use: No  . Sexual activity: Not on file

## 2020-11-18 ENCOUNTER — Telehealth: Payer: Self-pay | Admitting: Orthopedic Surgery

## 2020-11-18 NOTE — Telephone Encounter (Signed)
Patient submitted medical release form, disability papers, $25.00 check payment to [employer]. Accepted 11/18/20

## 2020-11-23 NOTE — Telephone Encounter (Signed)
Patient previously has had forms completed for intermittent FMLA.  Per Dr. Marlou Sa, due to knee osteoarthritis, okay to complete new form for intermittent FMLA . Note entered into system.

## 2020-12-16 ENCOUNTER — Other Ambulatory Visit: Payer: Self-pay

## 2020-12-16 ENCOUNTER — Ambulatory Visit (INDEPENDENT_AMBULATORY_CARE_PROVIDER_SITE_OTHER): Payer: BC Managed Care – PPO | Admitting: Orthopedic Surgery

## 2020-12-16 DIAGNOSIS — M1712 Unilateral primary osteoarthritis, left knee: Secondary | ICD-10-CM | POA: Diagnosis not present

## 2020-12-18 ENCOUNTER — Encounter: Payer: Self-pay | Admitting: Orthopedic Surgery

## 2020-12-18 MED ORDER — METHYLPREDNISOLONE ACETATE 40 MG/ML IJ SUSP
40.0000 mg | INTRAMUSCULAR | Status: AC | PRN
  Administered 2020-12-16: 40 mg via INTRA_ARTICULAR

## 2020-12-18 MED ORDER — BUPIVACAINE HCL 0.25 % IJ SOLN
4.0000 mL | INTRAMUSCULAR | Status: AC | PRN
  Administered 2020-12-16: 15:00:00 4 mL via INTRA_ARTICULAR

## 2020-12-18 MED ORDER — LIDOCAINE HCL 1 % IJ SOLN
5.0000 mL | INTRAMUSCULAR | Status: AC | PRN
  Administered 2020-12-16: 15:00:00 5 mL

## 2020-12-18 NOTE — Progress Notes (Signed)
Office Visit Note   Patient: Daniel Caldwell           Date of Birth: 01/22/1966           MRN: [id number] Visit Date: 12/16/2020 Requested by: Eber Hong, [address] [address] PCP: Eber Hong, MD  Subjective: Chief Complaint  Patient presents with  . Left Knee - Pain    HPI: Daniel Caldwell is a 54 year old patient with left knee pain.  Has known history of left knee arthritis.  Had injection about 3 months ago.  Did well with that to some degree but not as well as usual.  He would like to have repeat injection today.  He is considering knee replacement potentially in the fall.  He still does vigorous type work at the [employer].              ROS: All systems reviewed are negative as they relate to the chief complaint within the history of present illness.  Patient denies  fevers or chills.   Assessment & Plan: Visit Diagnoses:  1. Unilateral primary osteoarthritis, left knee     Plan: Impression is left knee arthritis.  Plan is left knee injection today.  Continue with nonweightbearing quad strengthening exercises.  In general he is developing more of a flexion contracture and therefore I think his arthritis is getting a little worse.  Not as much effusion today as he has had in the past which is a good sign.  Taking over-the-counter medication which helps to a marginal extent.  He will let us know if he wants to proceed with knee replacement in the future.  Follow-Up Instructions: Return if symptoms worsen or fail to improve.   Orders:  No orders of the defined types were placed in this encounter.  No orders of the defined types were placed in this encounter.     Procedures: Large Joint Inj: L knee on 12/16/2020 3:00 PM Indications: diagnostic evaluation, joint swelling and pain Details: 18 G 1.5 in needle, superolateral approach  Arthrogram: No  Medications: 5 mL lidocaine 1 %; 40 mg methylPREDNISolone acetate 40 MG/ML; 4 mL bupivacaine 0.25  % Outcome: tolerated well, no immediate complications Procedure, treatment alternatives, risks and benefits explained, specific risks discussed. Consent was given by the patient. Immediately prior to procedure a time out was called to verify the correct patient, procedure, equipment, support staff and site/side marked as required. Patient was prepped and draped in the usual sterile fashion.      Clinical Data: No additional findings.  Objective: Vital Signs: There were no vitals taken for this visit.  Physical Exam:   Constitutional: Patient appears well-developed HEENT:  Head: Normocephalic Eyes:EOM are normal Neck: Normal range of motion Cardiovascular: Normal rate Pulmonary/chest: Effort normal Neurologic: Patient is alert Skin: Skin is warm Psychiatric: Patient has normal mood and affect   Ortho Exam: Ortho exam demonstrates about a 10 degree flexion contracture in the left knee.  No effusion.  Collateral crucial ligaments are stable.  Pedal pulses palpable.  Ankle dorsiflexion intact.  No groin pain with internal or external Tatian of the leg.  Alignment intact.  Has flexion to about 120.  Lateral greater than medial joint line tenderness.  Specialty Comments:  No specialty comments available.  Imaging: No results found.   PMFS History: Patient Active Problem List   Diagnosis Date Noted  . Recurrent right inguinal hernia 01/22/2018  . Left inguinal hernia 01/22/2018   Past Medical History:  Diagnosis Date  . Left inguinal hernia 01/22/2018  . Recurrent right inguinal hernia 01/22/2018  . Sleep apnea    uses CPAP    Family History  Problem Relation Age of Onset  . Colon cancer Neg Hx   . Colon polyps Neg Hx   . Esophageal cancer Neg Hx   . Rectal cancer Neg Hx   . Stomach cancer Neg Hx     Past Surgical History:  Procedure Laterality Date  . INGUINAL HERNIA REPAIR Bilateral 01/22/2018   Procedure: OPEN BILATERAL INGUINAL HERNIA REPAIR ERAS PATHWAY;   Surgeon: Fanny Skates, MD;  Location: [facility];  Service: General;  Laterality: Bilateral;  . INSERTION OF MESH Bilateral 01/22/2018   Procedure: INSERTION OF MESH;  Surgeon: Fanny Skates, MD;  Location: [place] [facility];  Service: General;  Laterality: Bilateral;  . KNEE SURGERY     Social History   Occupational History  . Not on file  Tobacco Use  . Smoking status: Never  . 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 Vaping Use: Never used  Substance and Sexual Activity  . Alcohol use: Yes    Alcohol/week: 3.0 standard drinks    Types: 3 Cans of beer per week  . Drug use: No  . Sexual activity: Not on file

## 2021-02-17 ENCOUNTER — Telehealth: Payer: Self-pay | Admitting: Orthopedic Surgery

## 2021-02-17 NOTE — Telephone Encounter (Signed)
Pt called requesting a new handicap placard. Pt states he took it to [facility] and they won't accepted it because they said Dr did not fill out correctly. Please call pt when new one is ready for pick up. Pt phone number is [phone].

## 2021-02-17 NOTE — Telephone Encounter (Signed)
Contacted patient and he states that placard was suppose to be permanent however this was not filled out indicating that. Informed patient that he is able to come pick new placard up when he is able to. Patient understood and had no further questions or concerns.

## 2021-03-22 ENCOUNTER — Ambulatory Visit: Payer: BC Managed Care – PPO | Admitting: Orthopedic Surgery

## 2021-04-07 ENCOUNTER — Ambulatory Visit (INDEPENDENT_AMBULATORY_CARE_PROVIDER_SITE_OTHER): Payer: BC Managed Care – PPO | Admitting: Orthopedic Surgery

## 2021-04-07 ENCOUNTER — Other Ambulatory Visit: Payer: Self-pay

## 2021-04-07 DIAGNOSIS — M25562 Pain in left knee: Secondary | ICD-10-CM | POA: Diagnosis not present

## 2021-04-07 DIAGNOSIS — M1712 Unilateral primary osteoarthritis, left knee: Secondary | ICD-10-CM | POA: Diagnosis not present

## 2021-04-11 ENCOUNTER — Encounter: Payer: Self-pay | Admitting: Orthopedic Surgery

## 2021-04-11 MED ORDER — BUPIVACAINE HCL 0.25 % IJ SOLN
4.0000 mL | INTRAMUSCULAR | Status: AC | PRN
  Administered 2021-04-07: 4 mL via INTRA_ARTICULAR

## 2021-04-11 MED ORDER — METHYLPREDNISOLONE ACETATE 40 MG/ML IJ SUSP
40.0000 mg | INTRAMUSCULAR | Status: AC | PRN
  Administered 2021-04-07: 40 mg via INTRA_ARTICULAR

## 2021-04-11 MED ORDER — LIDOCAINE HCL 1 % IJ SOLN
5.0000 mL | INTRAMUSCULAR | Status: AC | PRN
Start: 2021-04-07 — End: 2021-04-07
  Administered 2021-04-07: 5 mL

## 2021-04-11 NOTE — Progress Notes (Signed)
Office Visit Note   Patient: Daniel Caldwell           Date of Birth: 24-May-1966           MRN: [id number] Visit Date: 04/07/2021 Requested by: Eber Hong, [place] [address] PCP: Eber Hong, MD  Subjective: Chief Complaint  Patient presents with   OTHER    Scan review    HPI: Daniel Caldwell is a 55 year old patient with left knee pain.  Has known history of arthritis.  Was considering getting his knee replacement done in the near future but his father's knee is more concerning and his father's knee may need revision.  He would like to be available to help his father with that recovery if possible.  Overall his symptoms are unchanged.  He reports significant pain which interferes with activities of daily living as well as standing and walking endurance.  Over-the-counter medications have not been helpful.  Denies any mechanical symptoms in the knee.  He continues to work doing a vigorous job.  Works at [employer].              ROS: All systems reviewed are negative as they relate to the chief complaint within the history of present illness.  Patient denies  fevers or chills.   Assessment & Plan: Visit Diagnoses:  1. Unilateral primary osteoarthritis, left knee     Plan: Impression is left knee arthritis.  Injection performed today.  Fortunately fairly minimal effusion in the knee today.  His symptoms are getting steadily gradually worse over time.  He will need knee replacement at sometime in the future but for now he is going to hold off until his father's issues are resolved.  Follow-Up Instructions: Return if symptoms worsen or fail to improve.   Orders:  No orders of the defined types were placed in this encounter.  No orders of the defined types were placed in this encounter.     Procedures: Large Joint Inj: L knee on 04/07/2021 12:21 PM Indications: diagnostic evaluation, joint swelling and pain Details: 18 G 1.5 in needle, superolateral  approach  Arthrogram: No  Medications: 5 mL lidocaine 1 %; 40 mg methylPREDNISolone acetate 40 MG/ML; 4 mL bupivacaine 0.25 % Outcome: tolerated well, no immediate complications Procedure, treatment alternatives, risks and benefits explained, specific risks discussed. Consent was given by the patient. Immediately prior to procedure a time out was called to verify the correct patient, procedure, equipment, support staff and site/side marked as required. Patient was prepped and draped in the usual sterile fashion.      Clinical Data: No additional findings.  Objective: Vital Signs: There were no vitals taken for this visit.  Physical Exam:   Constitutional: Patient appears well-developed HEENT:  Head: Normocephalic Eyes:EOM are normal Neck: Normal range of motion Cardiovascular: Normal rate Pulmonary/chest: Effort normal Neurologic: Patient is alert Skin: Skin is warm Psychiatric: Patient has normal mood and affect   Ortho Exam: Ortho exam demonstrates full active and passive range of motion of the ankle and hip.  Left knee has a 5 degree flexion contracture but can bend to about 100 degrees.  Slight valgus alignment.  No effusion today.  Lateral greater than medial joint line tenderness with intact extensor mechanism intact ankle dorsiflexion palpable pedal pulses.  Specialty Comments:  No specialty comments available.  Imaging: No results found.   PMFS History: Patient Active Problem List   Diagnosis Date Noted   Recurrent right inguinal hernia  01/22/2018   Left inguinal hernia 01/22/2018   Past Medical History:  Diagnosis Date   Left inguinal hernia 01/22/2018   Recurrent right inguinal hernia 01/22/2018   Sleep apnea    uses CPAP    Family History  Problem Relation Age of Onset   Colon cancer Neg Hx    Colon polyps Neg Hx    Esophageal cancer Neg Hx    Rectal cancer Neg Hx    Stomach cancer Neg Hx     Past Surgical History:  Procedure Laterality Date    INGUINAL HERNIA REPAIR Bilateral 01/22/2018   Procedure: OPEN BILATERAL INGUINAL HERNIA REPAIR ERAS PATHWAY;  Surgeon: Fanny Skates, MD;  Location: [facility];  Service: General;  Laterality: Bilateral;   INSERTION OF MESH Bilateral 01/22/2018   Procedure: INSERTION OF MESH;  Surgeon: Fanny Skates, MD;  Location: [facility];  Service: General;  Laterality: Bilateral;   KNEE SURGERY     Social History   Occupational History   Not on file  Tobacco Use   Smoking status: Never   Smokeless tobacco: Former    Types: Nurse, children's Use: Never used  Substance and Sexual Activity   Alcohol use: Yes    Alcohol/week: 3.0 standard drinks    Types: 3 Cans of beer per week   Drug use: No   Sexual activity: Not on file

## 2021-07-07 ENCOUNTER — Ambulatory Visit (INDEPENDENT_AMBULATORY_CARE_PROVIDER_SITE_OTHER): Payer: BC Managed Care – PPO | Admitting: Orthopedic Surgery

## 2021-07-07 ENCOUNTER — Other Ambulatory Visit: Payer: Self-pay

## 2021-07-07 DIAGNOSIS — M1712 Unilateral primary osteoarthritis, left knee: Secondary | ICD-10-CM | POA: Diagnosis not present

## 2021-07-07 MED ORDER — OXYCODONE HCL 5 MG PO CAPS: 5.0000 mg | ORAL_CAPSULE | Freq: Every evening | ORAL | 0 refills | Status: DC | PRN

## 2021-07-08 ENCOUNTER — Encounter: Payer: Self-pay | Admitting: Orthopedic Surgery

## 2021-07-08 MED ORDER — BUPIVACAINE HCL 0.25 % IJ SOLN
4.0000 mL | INTRAMUSCULAR | Status: AC | PRN
  Administered 2021-07-07: 4 mL via INTRA_ARTICULAR

## 2021-07-08 MED ORDER — LIDOCAINE HCL 1 % IJ SOLN
5.0000 mL | INTRAMUSCULAR | Status: AC | PRN
  Administered 2021-07-07: 5 mL

## 2021-07-08 MED ORDER — METHYLPREDNISOLONE ACETATE 40 MG/ML IJ SUSP
40.0000 mg | INTRAMUSCULAR | Status: AC | PRN
  Administered 2021-07-07: 40 mg via INTRA_ARTICULAR

## 2021-07-08 NOTE — Progress Notes (Signed)
Office Visit Note   Patient: Daniel Caldwell           Date of Birth: [birth year]           MRN: [id number] Visit Date: 07/07/2021 Requested by: Eber Hong, [address] [place] [address] PCP: Eber Hong, MD  Subjective: Chief Complaint  Patient presents with   Knee Pain    HPI: [name] is a 56 year old patient with known left knee pain.  Would like repeat aspiration and injection today.  The injections last about 4 weeks.  Also takes oxycodone at night.  Still works in a very physically demanding job at the [employer].  Would like a prescription for a cane as well.              ROS: All systems reviewed are negative as they relate to the chief complaint within the history of present illness.  Patient denies  fevers or chills.   Assessment & Plan: Visit Diagnoses:  1. Unilateral primary osteoarthritis, left knee     Plan: Impression is left knee arthritis which is progressing.  Has about a 5 to 7 degree flexion contracture.  Moderate effusion is present.  Plan is aspiration and injection today with pain prescription provided.  Note for out of work tonight provided.  Oxycodone prescription written for nighttime use only due to severe arthritis and demanding job.  Counseled him about the need to avoid dependency.  Follow-up with [place] as needed.  He will need knee replacement sometime in the future.  Follow-Up Instructions: Return if symptoms worsen or fail to improve.   Orders:  No orders of the defined types were placed in this encounter.  Meds ordered this encounter  Medications   oxycodone (OXY-IR) 5 MG capsule    Sig: Take 1 capsule (5 mg total) by mouth at bedtime as needed.    Dispense:  30 capsule    Refill:  0      Procedures: Large Joint Inj: L knee on 07/07/2021 7:36 AM Indications: diagnostic evaluation, joint swelling and pain Details: 18 G 1.5 in needle, superolateral approach  Arthrogram: No  Medications: 5 mL lidocaine 1 %; 40 mg  methylPREDNISolone acetate 40 MG/ML; 4 mL bupivacaine 0.25 % Outcome: tolerated well, no immediate complications Procedure, treatment alternatives, risks and benefits explained, specific risks discussed. Consent was given by the patient. Immediately prior to procedure a time out was called to verify the correct patient, procedure, equipment, support staff and site/side marked as required. Patient was prepped and draped in the usual sterile fashion.      Clinical Data: No additional findings.  Objective: Vital Signs: There were no vitals taken for this visit.  Physical Exam:   Constitutional: Patient appears well-developed HEENT:  Head: Normocephalic Eyes:EOM are normal Neck: Normal range of motion Cardiovascular: Normal rate Pulmonary/chest: Effort normal Neurologic: Patient is alert Skin: Skin is warm Psychiatric: Patient has normal mood and affect   Ortho Exam: Ortho exam demonstrates flexion contracture of about 7 degrees in the left knee with mild effusion.  Extensor mechanism intact.  Pedal pulses palpable.  No groin pain with internal ex rotation of the leg.  No calf tenderness.  Range of motion is also to about 115 of flexion.  Collateral and cruciate ligaments are stable.  Extensor mechanism intact  Specialty Comments:  No specialty comments available.  Imaging: No results found.   PMFS History: Patient Active Problem List   Diagnosis Date Noted   Recurrent right  inguinal hernia 01/22/2018   Left inguinal hernia 01/22/2018   Past Medical History:  Diagnosis Date   Left inguinal hernia 01/22/2018   Recurrent right inguinal hernia 01/22/2018   Sleep apnea    uses CPAP    Family History  Problem Relation Age of Onset   Colon cancer Neg Hx    Colon polyps Neg Hx    Esophageal cancer Neg Hx    Rectal cancer Neg Hx    Stomach cancer Neg Hx     Past Surgical History:  Procedure Laterality Date   INGUINAL HERNIA REPAIR Bilateral 01/22/2018   Procedure: OPEN  BILATERAL INGUINAL HERNIA REPAIR ERAS PATHWAY;  Surgeon: Fanny Skates, MD;  Location: [facility];  Service: General;  Laterality: Bilateral;   INSERTION OF MESH Bilateral 01/22/2018   Procedure: INSERTION OF MESH;  Surgeon: Fanny Skates, MD;  Location: [facility];  Service: General;  Laterality: Bilateral;   KNEE SURGERY     Social History   Occupational History   Not on file  Tobacco Use   Smoking status: Never   Smokeless tobacco: Former    Types: Nurse, children's Use: Never used  Substance and Sexual Activity   Alcohol use: Yes    Alcohol/week: 3.0 standard drinks    Types: 3 Cans of beer per week   Drug use: No   Sexual activity: Not on file

## 2021-07-09 ENCOUNTER — Telehealth: Payer: Self-pay | Admitting: Orthopedic Surgery

## 2021-07-09 NOTE — Telephone Encounter (Signed)
Patient called advised the pharmacy is going to contact Dr Marlou Sa to see if patient can get a 30 day supply of Rx Oxycodone.  The number to contact patient is [phone]

## 2021-07-12 ENCOUNTER — Telehealth: Payer: Self-pay | Admitting: Orthopedic Surgery

## 2021-07-12 NOTE — Telephone Encounter (Signed)
Pt called stating he was speaking to [place] about having [facility] reach out. He states he spoke to them and they said they would give our office a call and he would like a CB with any updates after Lauren talks to them.   [phone]

## 2021-07-12 NOTE — Telephone Encounter (Signed)
IC patient advised had been trying to reach pharmacy since Friday. I will try calling alternate number he typically calls at [phone] to see if I can get assistance and additional clarification on what is needed.

## 2021-07-14 NOTE — Telephone Encounter (Signed)
IC insurance to initiate PA for medication [phone] and was advised by Mliss Sax that insurance does not require auth. They advised pharmacy needed to call help desk for an override at [phone].  I have called and LM on pharmacy VM advising of this. Also notified patient.

## 2021-07-15 ENCOUNTER — Telehealth: Payer: Self-pay | Admitting: Orthopedic Surgery

## 2021-07-15 NOTE — Telephone Encounter (Signed)
[place] for 1 tab q 4 -6 prn pain # 35 thx

## 2021-07-15 NOTE — Telephone Encounter (Signed)
Pt asking for a call back from [place] states pharmacy stated to call after 3 pm. Pt phone number is [phone].

## 2021-08-25 ENCOUNTER — Telehealth: Payer: Self-pay | Admitting: Orthopedic Surgery

## 2021-08-25 NOTE — Telephone Encounter (Signed)
Not recommended.

## 2021-08-25 NOTE — Telephone Encounter (Signed)
IC advised-verbalized understanding.

## 2021-08-25 NOTE — Telephone Encounter (Signed)
Pt called. States cortisone did not last as long as normal. Wondering if he can get another cortisone before the 3 months?   CB [phone]

## 2021-10-06 ENCOUNTER — Ambulatory Visit (INDEPENDENT_AMBULATORY_CARE_PROVIDER_SITE_OTHER): Payer: BC Managed Care – PPO

## 2021-10-06 ENCOUNTER — Encounter: Payer: Self-pay | Admitting: Orthopedic Surgery

## 2021-10-06 ENCOUNTER — Ambulatory Visit: Payer: BC Managed Care – PPO | Admitting: Orthopedic Surgery

## 2021-10-06 VITALS — Ht 70.0 in | Wt 172.0 lb

## 2021-10-06 DIAGNOSIS — M1712 Unilateral primary osteoarthritis, left knee: Secondary | ICD-10-CM

## 2021-10-08 ENCOUNTER — Encounter: Payer: Self-pay | Admitting: Orthopedic Surgery

## 2021-10-08 MED ORDER — BUPIVACAINE HCL 0.25 % IJ SOLN
4.0000 mL | INTRAMUSCULAR | Status: AC | PRN
  Administered 2021-10-06: 4 mL via INTRA_ARTICULAR

## 2021-10-08 MED ORDER — LIDOCAINE HCL 1 % IJ SOLN
5.0000 mL | INTRAMUSCULAR | Status: AC | PRN
  Administered 2021-10-06: 5 mL

## 2021-10-08 NOTE — Progress Notes (Addendum)
? ?Office Visit Note ?  ?Patient: Daniel Caldwell           ?Date of Birth: Nov 07, 1965           ?MRN: [id number] ?Visit Date: 10/06/2021 ?Requested by: Eber Hong, MD ?[place] [address] ?PCP: Eber Hong, MD ? ?Subjective: ?Chief Complaint  ?Patient presents with  ? Left Knee - Pain  ? ? ?HPI: Daniel Caldwell is a 56 year old patient with known left knee arthritis.  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  Has developed significant lateral compartment arthritis since that time.  He has undergone multiple injections and medical management of his arthritis since his surgery.  No personal or family history of DVT or pulmonary embolism.  He has been using Voltaren as well as Biofreeze.  Had left knee injection 07/07/2021.  He does very physical work at the [employer].  He has wife and children at home. ?             ?ROS: All systems reviewed are negative as they relate to the chief complaint within the history of present illness.  Patient denies  fevers or chills. ? ? ?Assessment & Plan: ?Visit Diagnoses:  ?1. Unilateral primary osteoarthritis, left knee   ? ? ?Plan: Impression is end-stage left knee arthritis with failure of conservative management.  Plan is left knee replacement which we will try to do as a press-fit knee.  Risk and benefits of the procedure discussed with the patient include not limited to infection nerve vessel damage incomplete pain relief as well as the very rigorous nature of the rehabilitative effort required.  Patient understands risk benefits and wishes to proceed.  Patient also describes triggering of the left third and fourth fingers which he wants to watch for now.  Left knee is aspirated and injected with Toradol at this time.  We will aim for surgery sometime at the beginning of summer per his request ? ?Follow-Up Instructions: No follow-ups on file.  ? ?Orders:  ?Orders Placed This Encounter  ?Procedures  ? XR KNEE 3 VIEW LEFT  ? ?No orders of the  defined types were placed in this encounter. ? ? ? ? Procedures: ?Large Joint Inj: L knee on 10/06/2021 7:35 PM ?Indications: diagnostic evaluation, joint swelling and pain ?Details: 18 G 1.5 in needle, superolateral approach ? ?Arthrogram: No ? ?Medications: 5 mL lidocaine 1 %; 4 mL bupivacaine 0.25 % ?Outcome: tolerated well, no immediate complications ?Procedure, treatment alternatives, risks and benefits explained, specific risks discussed. Consent was given by the patient. Immediately prior to procedure a time out was called to verify the correct patient, procedure, equipment, support staff and site/side marked as required. Patient was prepped and draped in the usual sterile fashion.  ? ? ?30 mg Toradol injected ? ?Clinical Data: ?No additional findings. ? ?Objective: ?Vital Signs: Ht '5\' 10"'$  (1.778 m)   Wt 172 lb (78 kg)   BMI 24.68 kg/m?  ? ?Physical Exam:  ? ?Constitutional: Patient appears well-developed ?HEENT:  ?Head: Normocephalic ?Eyes:EOM are normal ?Neck: Normal range of motion ?Cardiovascular: Normal rate ?Pulmonary/chest: Effort normal ?Neurologic: Patient is alert ?Skin: Skin is warm ?Psychiatric: Patient has normal mood and affect ? ? ?Ortho Exam: Ortho exam demonstrates on the left knee about 7 degree lack of full extension.  He can bend to about 110.  Collaterals are stable.  Has lateral greater than medial joint line tenderness with palpable pedal pulses and intact dorsiflexion strength  on that left leg.  No groin pain with internal/external rotation of the left leg.  Extensor mechanism is intact.  No skin changes present in the left knee region ? ?Specialty Comments:  ?No specialty comments available. ? ?Imaging: ?No results found. ? ? ?PMFS History: ?Patient Active Problem List  ? Diagnosis Date Noted  ? Recurrent right inguinal hernia 01/22/2018  ? Left inguinal hernia 01/22/2018  ? ?Past Medical History:  ?Diagnosis Date  ? Left inguinal hernia 01/22/2018  ? Recurrent right inguinal hernia  01/22/2018  ? Sleep apnea   ? uses CPAP  ?  ?Family History  ?Problem Relation Age of Onset  ? Colon cancer Neg Hx   ? Colon polyps Neg Hx   ? Esophageal cancer Neg Hx   ? Rectal cancer Neg Hx   ? Stomach cancer Neg Hx   ?  ?Past Surgical History:  ?Procedure Laterality Date  ? INGUINAL HERNIA REPAIR Bilateral 01/22/2018  ? Procedure: OPEN BILATERAL INGUINAL HERNIA REPAIR ERAS PATHWAY;  Surgeon: Fanny Skates, MD;  Location: [facility];  Service: General;  Laterality: Bilateral;  ? INSERTION OF MESH Bilateral 01/22/2018  ? Procedure: INSERTION OF MESH;  Surgeon: Fanny Skates, MD;  Location: [facility];  Service: General;  Laterality: Bilateral;  ? KNEE SURGERY    ? ?Social History  ? ?Occupational History  ? Not on file  ?Tobacco Use  ? Smoking status: Never  ? Smokeless tobacco: Former  ?  Types: Chew  ?Vaping Use  ? Vaping Use: Never used  ?Substance and Sexual Activity  ? Alcohol use: Yes  ?  Alcohol/week: 3.0 standard drinks  ?  Types: 3 Cans of beer per week  ? Drug use: No  ? Sexual activity: Not on file  ? ? ? ? ? ?

## 2021-11-11 ENCOUNTER — Telehealth: Payer: Self-pay | Admitting: Orthopedic Surgery

## 2021-11-11 NOTE — Telephone Encounter (Signed)
[employer] forms received. To [employer]. 

## 2021-11-17 ENCOUNTER — Telehealth: Payer: Self-pay | Admitting: Orthopedic Surgery

## 2021-11-17 NOTE — Telephone Encounter (Signed)
Pt submitted medical release forms. Pt states spoke with Izora Gala from [employer] and FMLA forms was faxed. Pt paid $25.00 check payment to [employer]. Accepted 11/17/21 ?

## 2021-11-19 ENCOUNTER — Encounter: Payer: Self-pay | Admitting: Gastroenterology

## 2021-11-23 NOTE — Pre-Procedure Instructions (Signed)
Surgical Instructions    Your procedure is scheduled on Tuesday, June 6th.  Report to [facility] Main Entrance "A" at 5:30 A.M., then check in with the Admitting office.  Call this number if you have problems the morning of surgery:  [phone]   If you have any questions prior to your surgery date call [phone]: Open Monday-Friday 8am-4pm    Remember:  Do not eat after midnight the night before your surgery  You may drink clear liquids until 4:30 a.m. the morning of your surgery.   Clear liquids allowed are: Water, Non-Citrus Juices (without pulp), Carbonated Beverages, Clear Tea, Black Coffee ONLY (NO MILK, CREAM OR POWDERED CREAMER of any kind), and Gatorade.   Enhanced Recovery after Surgery for Orthopedics Enhanced Recovery after Surgery is a protocol used to improve the stress on your body and your recovery after surgery.  Patient Instructions  The day of surgery (if you do NOT have diabetes):  Drink ONE (1) Pre-Surgery Clear Ensure by 4:30 am the morning of surgery   This drink was given to you during your hospital  pre-op appointment visit. Nothing else to drink after completing the  Pre-Surgery Clear Ensure.         If you have questions, please contact your surgeon's office.     Take these medicines the morning of surgery:  fluticasone (FLONASE)-if needed  As of today, STOP taking any Aspirin (unless otherwise instructed by your surgeon) Aleve, Naproxen, Ibuprofen, Motrin, Advil, Goody's, BC's, all herbal medications, fish oil, and all vitamins.           Do not wear jewelry Do not wear lotions, powders, colognes, or deodorant. Men may shave face and neck. Do not bring valuables to the hospital.  [facility] is not responsible for any belongings or valuables. .   Do NOT Smoke (Tobacco/Vaping)  24 hours prior to your procedure  If you use a CPAP at night, you may bring your mask for your overnight stay.   Contacts, glasses, hearing aids, dentures or  partials may not be worn into surgery, please bring cases for these belongings   For patients admitted to the hospital, discharge time will be determined by your treatment team.   Patients discharged the day of surgery will not be allowed to drive home, and someone needs to stay with them for 24 hours.   SURGICAL WAITING ROOM VISITATION Patients having surgery or a procedure in a hospital may have two support people. Children under the age of 53 must have an adult with them who is not the patient. They may stay in the waiting area during the procedure and may switch out with other visitors. If the patient needs to stay at the hospital during part of their recovery, the visitor guidelines for inpatient rooms apply.  Please refer to the [facility] website for the visitor guidelines for Inpatients (after your surgery is over and you are in a regular room).       Special instructions:    Oral Hygiene is also important to reduce your risk of infection.  Remember - BRUSH YOUR TEETH THE MORNING OF SURGERY WITH YOUR REGULAR TOOTHPASTE   [facility]- Preparing For Surgery  Before surgery, you can play an important role. Because skin is not sterile, your skin needs to be as free of germs as possible. You can reduce the number of germs on your skin by washing with CHG (chlorahexidine gluconate) Soap before surgery.  CHG is an antiseptic cleaner which kills germs  and bonds with the skin to continue killing germs even after washing.     Please do not use if you have an allergy to CHG or antibacterial soaps. If your skin becomes reddened/irritated stop using the CHG.  Do not shave (including legs and underarms) for at least 48 hours prior to first CHG shower. It is OK to shave your face.  Please follow these instructions carefully.     Shower the NIGHT BEFORE SURGERY and the MORNING OF SURGERY with CHG Soap.   If you chose to wash your hair, wash your hair first as usual with your normal  shampoo. After you shampoo, rinse your hair and body thoroughly to remove the shampoo.  Then [employer] and genitals (private parts) with your normal soap and rinse thoroughly to remove soap.  After that Use CHG Soap as you would any other liquid soap. You can apply CHG directly to the skin and wash gently with a scrungie or a clean washcloth.   Apply the CHG Soap to your body ONLY FROM THE NECK DOWN.  Do not use on open wounds or open sores. Avoid contact with your eyes, ears, mouth and genitals (private parts). Wash Face and genitals (private parts)  with your normal soap.   Wash thoroughly, paying special attention to the area where your surgery will be performed.  Thoroughly rinse your body with warm water from the neck down.  DO NOT shower/wash with your normal soap after using and rinsing off the CHG Soap.  Pat yourself dry with a CLEAN TOWEL.  Wear CLEAN PAJAMAS to bed the night before surgery  Place CLEAN SHEETS on your bed the night before your surgery  DO NOT SLEEP WITH PETS.   Day of Surgery:  Take a shower with CHG soap. Wear Clean/Comfortable clothing the morning of surgery Do not apply any deodorants/lotions.   Remember to brush your teeth WITH YOUR REGULAR TOOTHPASTE.    If you received a COVID test during your pre-op visit, it is requested that you wear a mask when out in public, stay away from anyone that may not be feeling well, and notify your surgeon if you develop symptoms. If you have been in contact with anyone that has tested positive in the last 10 days, please notify your surgeon.    Please read over the following fact sheets that you were given.

## 2021-11-24 ENCOUNTER — Encounter (HOSPITAL_COMMUNITY)
Admission: RE | Admit: 2021-11-24 | Discharge: 2021-11-24 | Disposition: A | Discharge status: Home / Self Care | Payer: BC Managed Care – PPO | Source: Ambulatory Visit | Attending: Orthopedic Surgery | Admitting: Orthopedic Surgery

## 2021-11-24 ENCOUNTER — Encounter (HOSPITAL_COMMUNITY): Payer: Self-pay

## 2021-11-24 ENCOUNTER — Other Ambulatory Visit: Payer: Self-pay

## 2021-11-24 VITALS — BP 128/67 | HR 50 | Temp 98.3°F | Resp 17 | Ht 70.0 in | Wt 163.6 lb

## 2021-11-24 DIAGNOSIS — E785 Hyperlipidemia, unspecified: Secondary | ICD-10-CM | POA: Insufficient documentation

## 2021-11-24 DIAGNOSIS — R001 Bradycardia, unspecified: Secondary | ICD-10-CM | POA: Diagnosis not present

## 2021-11-24 DIAGNOSIS — Z01818 Encounter for other preprocedural examination: Secondary | ICD-10-CM | POA: Diagnosis present

## 2021-11-24 DIAGNOSIS — R7303 Prediabetes: Secondary | ICD-10-CM | POA: Insufficient documentation

## 2021-11-24 HISTORY — DX: Mixed hyperlipidemia: E78.2

## 2021-11-24 HISTORY — DX: Prediabetes: R73.03

## 2021-11-24 LAB — SURGICAL PCR SCREEN
MRSA, PCR: NEGATIVE
Staphylococcus aureus: NEGATIVE

## 2021-11-24 LAB — URINALYSIS, ROUTINE W REFLEX MICROSCOPIC
Bacteria, UA: NONE SEEN
Bilirubin Urine: NEGATIVE
Glucose, UA: NEGATIVE mg/dL
Ketones, ur: 5 mg/dL — AB
Leukocytes,Ua: NEGATIVE
Nitrite: NEGATIVE
Protein, ur: 30 mg/dL — AB
Specific Gravity, Urine: 1.018 (ref 1.005–1.030)
pH: 5 (ref 5.0–8.0)

## 2021-11-24 LAB — CBC
HCT: 43.6 % (ref 39.0–52.0)
Hemoglobin: 14.2 g/dL (ref 13.0–17.0)
MCH: 28.2 pg (ref 26.0–34.0)
MCHC: 32.6 g/dL (ref 30.0–36.0)
MCV: 86.7 fL (ref 80.0–100.0)
Platelets: 260 10*3/uL (ref 150–400)
RBC: 5.03 MIL/uL (ref 4.22–5.81)
RDW: 13.2 % (ref 11.5–15.5)
WBC: 4.1 10*3/uL (ref 4.0–10.5)
nRBC: 0 % (ref 0.0–0.2)

## 2021-11-24 LAB — BASIC METABOLIC PANEL
Anion gap: 8 (ref 5–15)
BUN: 12 mg/dL (ref 6–20)
CO2: 27 mmol/L (ref 22–32)
Calcium: 9.4 mg/dL (ref 8.9–10.3)
Chloride: 105 mmol/L (ref 98–111)
Creatinine, Ser: 1.02 mg/dL (ref 0.61–1.24)
GFR, Estimated: >60 mL/min (ref >60)
Glucose, Bld: 94 mg/dL (ref 70–99)
Potassium: 3.9 mmol/L (ref 3.5–5.1)
Sodium: 140 mmol/L (ref 135–145)

## 2021-11-24 NOTE — Pre-Procedure Instructions (Signed)
Surgical Instructions    Your procedure is scheduled on Tuesday, June 6th.  Report to [facility] Main Entrance "A" at 5:30 A.M., then check in with the Admitting office.  Call this number if you have problems the morning of surgery:  [phone]   If you have any questions prior to your surgery date call [phone]: Open Monday-Friday 8am-4pm    Remember:  Do not eat after midnight the night before your surgery  You may drink clear liquids until 4:30 a.m. the morning of your surgery.   Clear liquids allowed are: Water, Non-Citrus Juices (without pulp), Carbonated Beverages, Clear Tea, Black Coffee ONLY (NO MILK, CREAM OR POWDERED CREAMER of any kind), and Gatorade.   Enhanced Recovery after Surgery for Orthopedics Enhanced Recovery after Surgery is a protocol used to improve the stress on your body and your recovery after surgery.   The night before surgery:  No food after midnight. ONLY clear liquids after midnight   The day of surgery (if you have diabetes): Drink ONE (1) 12 oz G2 given to you in your pre admission testing appointment by 4:30AM the morning of surgery. Drink in one sitting. Do not sip.  This drink was given to you during your hospital  pre-op appointment visit.  Nothing else to drink after completing the  12 oz bottle of G2.         If you have questions, please contact your surgeon's office.      Take these medicines the morning of surgery:  fluticasone (FLONASE)-if needed  As of today, STOP taking any Aspirin (unless otherwise instructed by your surgeon) Aleve, Naproxen, Ibuprofen, Motrin, Advil, Goody's, BC's, all herbal medications, fish oil, and all vitamins.           Do not wear jewelry Do not wear lotions, powders, colognes, or deodorant. Men may shave face and neck. Do not bring valuables to the hospital.  [facility] is not responsible for any belongings or valuables. .   Do NOT Smoke (Tobacco/Vaping)  24 hours prior to your  procedure  If you use a CPAP at night, you may bring your mask for your overnight stay.   Contacts, glasses, hearing aids, dentures or partials may not be worn into surgery, please bring cases for these belongings   For patients admitted to the hospital, discharge time will be determined by your treatment team.   Patients discharged the day of surgery will not be allowed to drive home, and someone needs to stay with them for 24 hours.   SURGICAL WAITING ROOM VISITATION Patients having surgery or a procedure in a hospital may have two support people. Children under the age of 61 must have an adult with them who is not the patient. They may stay in the waiting area during the procedure and may switch out with other visitors. If the patient needs to stay at the hospital during part of their recovery, the visitor guidelines for inpatient rooms apply.  Please refer to the [facility] website for the visitor guidelines for Inpatients (after your surgery is over and you are in a regular room).       Special instructions:    Oral Hygiene is also important to reduce your risk of infection.  Remember - BRUSH YOUR TEETH THE MORNING OF SURGERY WITH YOUR REGULAR TOOTHPASTE   [facility]- Preparing For Surgery  Before surgery, you can play an important role. Because skin is not sterile, your skin needs to be as free of germs as  possible. You can reduce the number of germs on your skin by washing with CHG (chlorahexidine gluconate) Soap before surgery.  CHG is an antiseptic cleaner which kills germs and bonds with the skin to continue killing germs even after washing.     Please do not use if you have an allergy to CHG or antibacterial soaps. If your skin becomes reddened/irritated stop using the CHG.  Do not shave (including legs and underarms) for at least 48 hours prior to first CHG shower. It is OK to shave your face.  Please follow these instructions carefully.     Shower the NIGHT BEFORE  SURGERY and the MORNING OF SURGERY with CHG Soap.   If you chose to wash your hair, wash your hair first as usual with your normal shampoo. After you shampoo, rinse your hair and body thoroughly to remove the shampoo.  Then [employer] and genitals (private parts) with your normal soap and rinse thoroughly to remove soap.  After that Use CHG Soap as you would any other liquid soap. You can apply CHG directly to the skin and wash gently with a scrungie or a clean washcloth.   Apply the CHG Soap to your body ONLY FROM THE NECK DOWN.  Do not use on open wounds or open sores. Avoid contact with your eyes, ears, mouth and genitals (private parts). Wash Face and genitals (private parts)  with your normal soap.   Wash thoroughly, paying special attention to the area where your surgery will be performed.  Thoroughly rinse your body with warm water from the neck down.  DO NOT shower/wash with your normal soap after using and rinsing off the CHG Soap.  Pat yourself dry with a CLEAN TOWEL.  Wear CLEAN PAJAMAS to bed the night before surgery  Place CLEAN SHEETS on your bed the night before your surgery  DO NOT SLEEP WITH PETS.   Day of Surgery:  Take a shower with CHG soap. Wear Clean/Comfortable clothing the morning of surgery Do not apply any deodorants/lotions.   Remember to brush your teeth WITH YOUR REGULAR TOOTHPASTE.    If you received a COVID test during your pre-op visit, it is requested that you wear a mask when out in public, stay away from anyone that may not be feeling well, and notify your surgeon if you develop symptoms. If you have been in contact with anyone that has tested positive in the last 10 days, please notify your surgeon.    Please read over the following fact sheets that you were given.

## 2021-11-24 NOTE — Progress Notes (Addendum)
PCP - Eber Hong, MD Cardiologist - denies  PPM/ICD - denies Chest x-ray - N/A EKG - ordered placed by anesthesia provider at PAT appointment- pt is pre-diabetic, HR 50 on check in. Pt denies chest pain, shortness of breath, dizziness or any other symptoms at PAT appointment.  Stress Test - denies ECHO - denies Cardiac Cath - denies  Sleep Study - pt repots having sleep study 4-5 years ago. Unsure of where this was done. Pt does not use his CPAP at home anymore.   Hgb A1c 6.1 in March. Pt states he was told to watch his diet and weight. Pt denies Diabetes. Per PCP pt is pre-diabetic.   Blood Thinner Instructions: N/A Aspirin Instructions: pt does not take ASA at home  ERAS Protcol - ERAS + G2   COVID TEST- N/A   Anesthesia review: HR 47 on EKG- pt asymptomatic. Ebony Hail aware at [facility] appointment.   Patient denies shortness of breath, fever, cough and chest pain at PAT appointment   All instructions explained to the patient, with a verbal understanding of the material. Patient agrees to go over the instructions while at home for a better understanding. The opportunity to ask questions was provided.

## 2021-11-25 ENCOUNTER — Encounter (HOSPITAL_COMMUNITY): Payer: Self-pay

## 2021-11-25 LAB — URINE CULTURE: Culture: NO GROWTH

## 2021-11-25 NOTE — Progress Notes (Signed)
Anesthesia Chart Review:  Case: [id number] Date/Time: 12/07/21 0715   Procedure: LEFT TOTAL KNEE ARTHROPLASTY (Left: Knee)   Anesthesia type: Spinal   Pre-op diagnosis: left knee osteoarthritis   Location: [facility] OR ROOM 06 / [place] OR   Surgeons: Meredith Pel, MD       DISCUSSION: Patient is a 56 year old male scheduled for the above procedure.  History includes never smoker, OSA (not using CPAP), pre-diabetes, mixed HLD, hernia (s/p bilateral IHR 01/22/18).  Last PCP visit with Dr. Brynda Greathouse [facility], see Care Everywhere) was on 09/08/21. He wrote, "The patient is doing very well overall except for the significant knee arthritis on the left." He notes patients plans for left TKA. A1c 6.1%, unchanged since 09/17/18. Lifestyle modification for HLD. One year follow-up planned.   EKG done as part of his preoperative evaluation due to HLD, pre-diabetes, and bradycardia. HR was 50 with VS. He reported know slow heart rate (50-60's). Per discussion with PAT RN, he denied chest pain, SOB, dizziness, syncope. EKG showed SB at 47 bpm.   Anesthesia team to evaluate on the day of surgery.   VS: BP 128/67   Pulse (!) 50   Temp 36.8 C (Oral)   Resp 17   Ht '5\' 10"'$  (1.778 m)   Wt 74.2 kg   SpO2 100%   BMI 23.47 kg/m   PROVIDERS: Eber Hong, MD is PCP [place], [place])   LABS: Labs reviewed: Acceptable for surgery. A1c 6.1% on 09/03/21 ([facility]). (all labs ordered are listed, but only abnormal results are displayed)  Labs Reviewed  URINALYSIS, ROUTINE W REFLEX MICROSCOPIC - Abnormal; Notable for the following components:      Result Value   Hgb urine dipstick MODERATE (*)    Ketones, ur 5 (*)    Protein, ur 30 (*)    All other components within normal limits  SURGICAL PCR SCREEN  URINE CULTURE  CBC  BASIC METABOLIC PANEL  Urine culture showed no growth.   IMAGES: 3V Xray left knee 10/06/21: "AP lateral merchant radiographs left knee reviewed.  Moderate to severe  arthritis  present in the lateral compartment best seen on the lateral  view.  Moderate medial and patellofemoral changes are present.  Overall  alignment intact.  Patella height normal relative to distal femur."   EKG: 11/24/21: Sinus bradycardia at 47 bpm Otherwise normal ECG No previous ECGs available Confirmed by Virgina Jock, Manish ([id number]) on 11/25/2021 9:37:05 AM   CV: N/A   Past Medical History:  Diagnosis Date   Left inguinal hernia 01/22/2018   Mixed hyperlipidemia    Pre-diabetes    Recurrent right inguinal hernia 01/22/2018   Sleep apnea    doesn't use CPAP anymore    Past Surgical History:  Procedure Laterality Date   INGUINAL HERNIA REPAIR Bilateral 01/22/2018   Procedure: OPEN BILATERAL INGUINAL HERNIA REPAIR ERAS PATHWAY;  Surgeon: Fanny Skates, MD;  Location: [place];  Service: General;  Laterality: Bilateral;   INSERTION OF MESH Bilateral 01/22/2018   Procedure: INSERTION OF MESH;  Surgeon: Fanny Skates, MD;  Location: [place];  Service: General;  Laterality: Bilateral;   KNEE SURGERY      MEDICATIONS:  clindamycin (CLEOCIN T) 1 % lotion   clobetasol cream (TEMOVATE) 0.05 %   fluticasone (FLONASE) 50 MCG/ACT nasal spray   Multiple Vitamins-Minerals (QC MENS DAILY MULTIVITAMIN PO)   oxycodone (OXY-IR) 5 MG capsule   No current facility-administered medications for this encounter.  Myra Gianotti, PA-C Surgical Short Stay/Anesthesiology [facility] Phone [phone] [facility] Phone [phone] 11/25/2021 1:34 PM

## 2021-11-25 NOTE — Anesthesia Preprocedure Evaluation (Addendum)
Anesthesia Evaluation  Patient identified by MRN, date of birth, ID band Patient awake    Reviewed: Allergy & Precautions, NPO status , Patient's Chart, lab work & pertinent test results  Airway Mallampati: II  TM Distance: >3 FB Neck ROM: Full    Dental no notable dental hx.    Pulmonary sleep apnea ,    Pulmonary exam normal breath sounds clear to auscultation       Cardiovascular negative cardio ROS Normal cardiovascular exam Rhythm:Regular Rate:Normal     Neuro/Psych negative neurological ROS  negative psych ROS   GI/Hepatic negative GI ROS, Neg liver ROS,   Endo/Other  negative endocrine ROS  Renal/GU negative Renal ROS     Musculoskeletal  (+) Arthritis ,   Abdominal   Peds  Hematology negative hematology ROS (+)   Anesthesia Other Findings left knee osteoarthritis  Reproductive/Obstetrics                            Anesthesia Physical Anesthesia Plan  ASA: 2  Anesthesia Plan: Regional and Spinal   Post-op Pain Management:    Induction: Intravenous  PONV Risk Score and Plan: 1 and Ondansetron, Dexamethasone, Propofol infusion, Midazolam and Treatment may vary due to age or medical condition  Airway Management Planned: Simple Face Mask  Additional Equipment:   Intra-op Plan:   Post-operative Plan:   Informed Consent: I have reviewed the patients History and Physical, chart, labs and discussed the procedure including the risks, benefits and alternatives for the proposed anesthesia with the patient or authorized representative who has indicated his/her understanding and acceptance.     Dental advisory given  Plan Discussed with: CRNA  Anesthesia Plan Comments: (PAT note written 11/25/2021 by Myra Gianotti, PA-C. )      Anesthesia Quick Evaluation

## 2021-12-06 MED ORDER — TRANEXAMIC ACID 1000 MG/10ML IV SOLN
2000.0000 mg | INTRAVENOUS | Status: DC
  Filled 2021-12-06: qty 20

## 2021-12-07 ENCOUNTER — Ambulatory Visit (HOSPITAL_COMMUNITY): Payer: BC Managed Care – PPO | Admitting: Vascular Surgery

## 2021-12-07 ENCOUNTER — Encounter (HOSPITAL_COMMUNITY): Payer: Self-pay | Admitting: Orthopedic Surgery

## 2021-12-07 ENCOUNTER — Encounter (HOSPITAL_COMMUNITY)
Admission: RE | Disposition: A | Discharge status: Home Health | Payer: Self-pay | Source: Home / Self Care | Attending: Orthopedic Surgery

## 2021-12-07 ENCOUNTER — Other Ambulatory Visit: Payer: Self-pay

## 2021-12-07 ENCOUNTER — Observation Stay (HOSPITAL_COMMUNITY)
Admission: RE | Admit: 2021-12-07 | Discharge: 2021-12-08 | Disposition: A | Discharge status: Home Health | Payer: BC Managed Care – PPO | Attending: Orthopedic Surgery | Admitting: Orthopedic Surgery

## 2021-12-07 DIAGNOSIS — Z01818 Encounter for other preprocedural examination: Secondary | ICD-10-CM

## 2021-12-07 DIAGNOSIS — Z87891 Personal history of nicotine dependence: Secondary | ICD-10-CM | POA: Diagnosis not present

## 2021-12-07 DIAGNOSIS — Z96652 Presence of left artificial knee joint: Secondary | ICD-10-CM

## 2021-12-07 DIAGNOSIS — M1712 Unilateral primary osteoarthritis, left knee: Principal | ICD-10-CM | POA: Insufficient documentation

## 2021-12-07 HISTORY — PX: TOTAL KNEE ARTHROPLASTY: SHX125

## 2021-12-07 SURGERY — ARTHROPLASTY, KNEE, TOTAL
Anesthesia: Regional | Site: Knee | Laterality: Left

## 2021-12-07 MED ORDER — ONDANSETRON HCL 4 MG/2ML IJ SOLN
INTRAMUSCULAR | Status: AC
  Filled 2021-12-07: qty 2

## 2021-12-07 MED ORDER — SODIUM CHLORIDE 0.9 % IV SOLN: INTRAVENOUS | Status: AC

## 2021-12-07 MED ORDER — POVIDONE-IODINE 10 % EX SWAB
2.0000 "application " | Freq: Once | CUTANEOUS | Status: AC
  Administered 2021-12-07: 2 via TOPICAL

## 2021-12-07 MED ORDER — BUPIVACAINE HCL 0.25 % IJ SOLN
INTRAMUSCULAR | Status: DC | PRN
  Administered 2021-12-07: 20 mL
  Administered 2021-12-07: 10 mL

## 2021-12-07 MED ORDER — BUPIVACAINE HCL (PF) 0.25 % IJ SOLN
INTRAMUSCULAR | Status: AC
  Filled 2021-12-07: qty 30

## 2021-12-07 MED ORDER — IRRISEPT - 450ML BOTTLE WITH 0.05% CHG IN STERILE WATER, USP 99.95% OPTIME
TOPICAL | Status: DC | PRN
  Administered 2021-12-07: 450 mL

## 2021-12-07 MED ORDER — OXYCODONE HCL 5 MG PO TABS
5.0000 mg | ORAL_TABLET | ORAL | Status: DC | PRN
  Administered 2021-12-07 (×2): 10 mg via ORAL
  Administered 2021-12-08: 5 mg via ORAL
  Administered 2021-12-08 (×2): 10 mg via ORAL
  Filled 2021-12-07 (×4): qty 2

## 2021-12-07 MED ORDER — MORPHINE SULFATE (PF) 4 MG/ML IV SOLN
INTRAVENOUS | Status: AC
  Filled 2021-12-07: qty 2

## 2021-12-07 MED ORDER — OXYCODONE HCL 5 MG PO TABS
ORAL_TABLET | ORAL | Status: AC
  Filled 2021-12-07: qty 2

## 2021-12-07 MED ORDER — PROPOFOL 500 MG/50ML IV EMUL
INTRAVENOUS | Status: DC | PRN
  Administered 2021-12-07: 50 ug/kg/min via INTRAVENOUS

## 2021-12-07 MED ORDER — BUPIVACAINE LIPOSOME 1.3 % IJ SUSP
INTRAMUSCULAR | Status: DC | PRN
  Administered 2021-12-07: 10 mL
  Administered 2021-12-07: 20 mL

## 2021-12-07 MED ORDER — CEFAZOLIN SODIUM-DEXTROSE 2-3 GM-%(50ML) IV SOLR
INTRAVENOUS | Status: DC | PRN
  Administered 2021-12-07: 2 g via INTRAVENOUS

## 2021-12-07 MED ORDER — FENTANYL CITRATE (PF) 100 MCG/2ML IJ SOLN: 25.0000 ug | INTRAMUSCULAR | Status: DC | PRN

## 2021-12-07 MED ORDER — ORAL CARE MOUTH RINSE: 15.0000 mL | Freq: Once | OROMUCOSAL | Status: AC

## 2021-12-07 MED ORDER — MIDAZOLAM HCL 2 MG/2ML IJ SOLN
INTRAMUSCULAR | Status: AC
Start: 2021-12-07 — End: ?
  Filled 2021-12-07: qty 2

## 2021-12-07 MED ORDER — ONDANSETRON HCL 4 MG/2ML IJ SOLN
INTRAMUSCULAR | Status: DC | PRN
  Administered 2021-12-07: 4 mg via INTRAVENOUS

## 2021-12-07 MED ORDER — CHLORHEXIDINE GLUCONATE 0.12 % MT SOLN
15.0000 mL | Freq: Once | OROMUCOSAL | Status: AC
  Administered 2021-12-07: 15 mL via OROMUCOSAL
  Filled 2021-12-07: qty 15

## 2021-12-07 MED ORDER — ASPIRIN 81 MG PO CHEW
81.0000 mg | CHEWABLE_TABLET | Freq: Two times a day (BID) | ORAL | Status: DC
  Administered 2021-12-07 – 2021-12-08 (×2): 81 mg via ORAL
  Filled 2021-12-07 (×2): qty 1

## 2021-12-07 MED ORDER — AMISULPRIDE (ANTIEMETIC) 5 MG/2ML IV SOLN: 10.0000 mg | Freq: Once | INTRAVENOUS | Status: DC | PRN

## 2021-12-07 MED ORDER — OXYCODONE HCL 5 MG PO TABS: 5.0000 mg | ORAL_TABLET | Freq: Once | ORAL | Status: DC | PRN

## 2021-12-07 MED ORDER — MENTHOL 3 MG MT LOZG: 1.0000 | LOZENGE | OROMUCOSAL | Status: DC | PRN

## 2021-12-07 MED ORDER — ACETAMINOPHEN 10 MG/ML IV SOLN: 1000.0000 mg | Freq: Once | INTRAVENOUS | Status: DC | PRN

## 2021-12-07 MED ORDER — BUPIVACAINE-EPINEPHRINE (PF) 0.5% -1:200000 IJ SOLN
INTRAMUSCULAR | Status: DC | PRN
  Administered 2021-12-07: 30 mL via PERINEURAL

## 2021-12-07 MED ORDER — BISMUTH SUBSALICYLATE 262 MG PO CHEW
524.0000 mg | CHEWABLE_TABLET | ORAL | Status: DC | PRN
Start: 2021-12-07 — End: 2021-12-08
  Filled 2021-12-07 (×2): qty 2

## 2021-12-07 MED ORDER — PROPOFOL 10 MG/ML IV BOLUS
INTRAVENOUS | Status: DC | PRN
  Administered 2021-12-07 (×2): 50 mg via INTRAVENOUS

## 2021-12-07 MED ORDER — 0.9 % SODIUM CHLORIDE (POUR BTL) OPTIME
TOPICAL | Status: DC | PRN
  Administered 2021-12-07: 4000 mL
  Administered 2021-12-07 (×2): 1000 mL

## 2021-12-07 MED ORDER — LACTATED RINGERS IV SOLN: INTRAVENOUS | Status: DC

## 2021-12-07 MED ORDER — CELECOXIB 100 MG PO CAPS
100.0000 mg | ORAL_CAPSULE | Freq: Two times a day (BID) | ORAL | Status: DC
  Administered 2021-12-07 – 2021-12-08 (×2): 100 mg via ORAL
  Filled 2021-12-07 (×3): qty 1

## 2021-12-07 MED ORDER — BUPIVACAINE LIPOSOME 1.3 % IJ SUSP
INTRAMUSCULAR | Status: AC
Start: 2021-12-07 — End: ?
  Filled 2021-12-07: qty 20

## 2021-12-07 MED ORDER — GABAPENTIN 300 MG PO CAPS
300.0000 mg | ORAL_CAPSULE | Freq: Three times a day (TID) | ORAL | Status: DC
  Administered 2021-12-07 – 2021-12-08 (×3): 300 mg via ORAL
  Filled 2021-12-07 (×3): qty 1

## 2021-12-07 MED ORDER — DEXAMETHASONE SODIUM PHOSPHATE 10 MG/ML IJ SOLN
INTRAMUSCULAR | Status: DC | PRN
  Administered 2021-12-07: 10 mg via INTRAVENOUS

## 2021-12-07 MED ORDER — DOCUSATE SODIUM 100 MG PO CAPS
100.0000 mg | ORAL_CAPSULE | Freq: Two times a day (BID) | ORAL | Status: DC
  Administered 2021-12-07 – 2021-12-08 (×2): 100 mg via ORAL
  Filled 2021-12-07 (×2): qty 1

## 2021-12-07 MED ORDER — LIDOCAINE 2% (20 MG/ML) 5 ML SYRINGE
INTRAMUSCULAR | Status: AC
  Filled 2021-12-07: qty 5

## 2021-12-07 MED ORDER — VANCOMYCIN HCL 1000 MG IV SOLR
INTRAVENOUS | Status: DC | PRN
  Administered 2021-12-07: 1000 mg via TOPICAL

## 2021-12-07 MED ORDER — POVIDONE-IODINE 10 % EX SWAB: 2.0000 "application " | Freq: Once | CUTANEOUS | Status: AC

## 2021-12-07 MED ORDER — VANCOMYCIN HCL IN DEXTROSE 1-5 GM/200ML-% IV SOLN
1000.0000 mg | INTRAVENOUS | Status: AC
  Administered 2021-12-07: 1000 mg via INTRAVENOUS
  Filled 2021-12-07: qty 200

## 2021-12-07 MED ORDER — DEXAMETHASONE SODIUM PHOSPHATE 10 MG/ML IJ SOLN
INTRAMUSCULAR | Status: AC
  Filled 2021-12-07: qty 1

## 2021-12-07 MED ORDER — CLONIDINE HCL (ANALGESIA) 100 MCG/ML EP SOLN
EPIDURAL | Status: DC | PRN
  Administered 2021-12-07: 1 mL via INTRA_ARTICULAR

## 2021-12-07 MED ORDER — FENTANYL CITRATE (PF) 250 MCG/5ML IJ SOLN
INTRAMUSCULAR | Status: AC
  Filled 2021-12-07: qty 5

## 2021-12-07 MED ORDER — SODIUM CHLORIDE 0.9 % IR SOLN
Status: DC | PRN
  Administered 2021-12-07: 3000 mL

## 2021-12-07 MED ORDER — METOCLOPRAMIDE HCL 5 MG PO TABS: 5.0000 mg | ORAL_TABLET | Freq: Three times a day (TID) | ORAL | Status: DC | PRN

## 2021-12-07 MED ORDER — PHENOL 1.4 % MT LIQD: 1.0000 | OROMUCOSAL | Status: DC | PRN

## 2021-12-07 MED ORDER — OXYCODONE HCL 5 MG/5ML PO SOLN: 5.0000 mg | Freq: Once | ORAL | Status: DC | PRN

## 2021-12-07 MED ORDER — CLONIDINE HCL (ANALGESIA) 100 MCG/ML EP SOLN
EPIDURAL | Status: AC
Start: 2021-12-07 — End: ?
  Filled 2021-12-07: qty 10

## 2021-12-07 MED ORDER — SODIUM CHLORIDE 0.9% FLUSH
INTRAVENOUS | Status: DC | PRN
  Administered 2021-12-07: 20 mL

## 2021-12-07 MED ORDER — POVIDONE-IODINE 7.5 % EX SOLN
Freq: Once | CUTANEOUS | Status: DC
  Filled 2021-12-07: qty 118

## 2021-12-07 MED ORDER — VANCOMYCIN HCL 1000 MG IV SOLR
INTRAVENOUS | Status: AC
  Filled 2021-12-07: qty 20

## 2021-12-07 MED ORDER — TRANEXAMIC ACID 1000 MG/10ML IV SOLN
INTRAVENOUS | Status: DC | PRN
  Administered 2021-12-07: 2000 mg via TOPICAL

## 2021-12-07 MED ORDER — BUPIVACAINE IN DEXTROSE 0.75-8.25 % IT SOLN
INTRATHECAL | Status: DC | PRN
  Administered 2021-12-07: 2 mL via INTRATHECAL

## 2021-12-07 MED ORDER — PROPOFOL 10 MG/ML IV BOLUS
INTRAVENOUS | Status: AC
  Filled 2021-12-07: qty 20

## 2021-12-07 MED ORDER — FENTANYL CITRATE (PF) 100 MCG/2ML IJ SOLN
INTRAMUSCULAR | Status: DC | PRN
  Administered 2021-12-07 (×2): 50 ug via INTRAVENOUS

## 2021-12-07 MED ORDER — CEFAZOLIN SODIUM-DEXTROSE 2-4 GM/100ML-% IV SOLN
INTRAVENOUS | Status: AC
  Filled 2021-12-07: qty 100

## 2021-12-07 MED ORDER — MIDAZOLAM HCL 5 MG/5ML IJ SOLN
INTRAMUSCULAR | Status: DC | PRN
  Administered 2021-12-07: 2 mg via INTRAVENOUS

## 2021-12-07 MED ORDER — MORPHINE SULFATE (PF) 4 MG/ML IV SOLN
INTRAVENOUS | Status: DC | PRN
  Administered 2021-12-07: 8 mg via SUBCUTANEOUS

## 2021-12-07 MED ORDER — TRANEXAMIC ACID-NACL 1000-0.7 MG/100ML-% IV SOLN
1000.0000 mg | INTRAVENOUS | Status: AC
  Administered 2021-12-07: 1000 mg via INTRAVENOUS
  Filled 2021-12-07: qty 100

## 2021-12-07 MED ORDER — METOCLOPRAMIDE HCL 5 MG/ML IJ SOLN: 5.0000 mg | Freq: Three times a day (TID) | INTRAMUSCULAR | Status: DC | PRN

## 2021-12-07 SURGICAL SUPPLY — 81 items
BAG COUNTER SPONGE SURGICOUNT (BAG) ×2 IMPLANT
BAG DECANTER FOR FLEXI CONT (MISCELLANEOUS) ×2 IMPLANT
BANDAGE ESMARK 6X9 LF (GAUZE/BANDAGES/DRESSINGS) ×1 IMPLANT
BLADE SAG 18X100X1.27 (BLADE) ×2 IMPLANT
BLADE SAGITTAL (BLADE) ×1
BLADE SAW THK.89X75X18XSGTL (BLADE) ×1 IMPLANT
BNDG COHESIVE 6X5 TAN STRL LF (GAUZE/BANDAGES/DRESSINGS) ×2 IMPLANT
BNDG ELASTIC 6X15 VLCR STRL LF (GAUZE/BANDAGES/DRESSINGS) ×2 IMPLANT
BNDG ESMARK 6X9 LF (GAUZE/BANDAGES/DRESSINGS) ×2
CLSR STERI-STRIP ANTIMIC 1/2X4 (GAUZE/BANDAGES/DRESSINGS) ×2 IMPLANT
CNTNR URN SCR LID CUP LEK RST (MISCELLANEOUS) ×1 IMPLANT
CONT SPEC 4OZ STRL OR WHT (MISCELLANEOUS) ×1
COOLER ICEMAN CLASSIC (MISCELLANEOUS) ×1 IMPLANT
COVER SURGICAL LIGHT HANDLE (MISCELLANEOUS) ×2 IMPLANT
CUFF TOURN SGL QUICK 34 (TOURNIQUET CUFF) ×1
CUFF TRNQT CYL 34X4.125X (TOURNIQUET CUFF) ×1 IMPLANT
DECANTER SPIKE VIAL GLASS SM (MISCELLANEOUS) ×2 IMPLANT
DRAPE INCISE IOBAN 66X45 STRL (DRAPES) IMPLANT
DRAPE ORTHO SPLIT 77X108 STRL (DRAPES) ×3
DRAPE SURG ORHT 6 SPLT 77X108 (DRAPES) ×3 IMPLANT
DRAPE U-SHAPE 47X51 STRL (DRAPES) ×2 IMPLANT
DRSG AQUACEL AG ADV 3.5X14 (GAUZE/BANDAGES/DRESSINGS) ×1 IMPLANT
DURAPREP 26ML APPLICATOR (WOUND CARE) ×4 IMPLANT
ELECT CAUTERY BLADE 6.4 (BLADE) ×2 IMPLANT
ELECT REM PT RETURN 9FT ADLT (ELECTROSURGICAL) ×2
ELECTRODE REM PT RTRN 9FT ADLT (ELECTROSURGICAL) ×1 IMPLANT
FEMORAL RETAINING CRUC KNEE #4 (Knees) ×1 IMPLANT
GAUZE SPONGE 4X4 12PLY STRL (GAUZE/BANDAGES/DRESSINGS) ×2 IMPLANT
GLOVE BIOGEL PI IND STRL 7.0 (GLOVE) ×1 IMPLANT
GLOVE BIOGEL PI IND STRL 8 (GLOVE) ×1 IMPLANT
GLOVE BIOGEL PI INDICATOR 7.0 (GLOVE) ×1
GLOVE BIOGEL PI INDICATOR 8 (GLOVE) ×1
GLOVE ECLIPSE 7.0 STRL STRAW (GLOVE) ×2 IMPLANT
GLOVE ECLIPSE 8.0 STRL XLNG CF (GLOVE) ×2 IMPLANT
GLOVE SURG ENC MOIS LTX SZ6.5 (GLOVE) ×6 IMPLANT
GOWN STRL NON-REIN LRG LVL3 (GOWN DISPOSABLE) ×1 IMPLANT
GOWN STRL REUS W/ TWL LRG LVL3 (GOWN DISPOSABLE) ×3 IMPLANT
GOWN STRL REUS W/TWL LRG LVL3 (GOWN DISPOSABLE) ×3
HANDPIECE INTERPULSE COAX TIP (DISPOSABLE) ×1
HOOD PEEL AWAY FLYTE STAYCOOL (MISCELLANEOUS) ×7 IMPLANT
IMMOBILIZER KNEE 20 (SOFTGOODS)
IMMOBILIZER KNEE 20 THIGH 36 (SOFTGOODS) IMPLANT
IMMOBILIZER KNEE 22 UNIV (SOFTGOODS) ×1 IMPLANT
IMMOBILIZER KNEE 24 THIGH 36 (MISCELLANEOUS) IMPLANT
IMMOBILIZER KNEE 24 UNIV (MISCELLANEOUS)
INSERT TIB BEARING X3 9 SZ5 (Insert) ×1 IMPLANT
KIT BASIN OR (CUSTOM PROCEDURE TRAY) ×2 IMPLANT
KIT TURNOVER KIT B (KITS) ×2 IMPLANT
KNEE PATELLA ASYMMETRIC 10X35 (Knees) ×1 IMPLANT
KNEE TIBIAL COMPONENT SZ5 (Knees) ×1 IMPLANT
MANIFOLD NEPTUNE II (INSTRUMENTS) ×2 IMPLANT
NDL SPNL 18GX3.5 QUINCKE PK (NEEDLE) ×1 IMPLANT
NEEDLE 22X1 1/2 (OR ONLY) (NEEDLE) ×4 IMPLANT
NEEDLE SPNL 18GX3.5 QUINCKE PK (NEEDLE) ×2 IMPLANT
NS IRRIG 1000ML POUR BTL (IV SOLUTION) ×7 IMPLANT
PACK TOTAL JOINT (CUSTOM PROCEDURE TRAY) ×2 IMPLANT
PAD ARMBOARD 7.5X6 YLW CONV (MISCELLANEOUS) ×4 IMPLANT
PAD CAST 3X4 CTTN HI CHSV (CAST SUPPLIES) IMPLANT
PAD CAST 4YDX4 CTTN HI CHSV (CAST SUPPLIES) ×1 IMPLANT
PAD COLD SHLDR WRAP-ON (PAD) ×1 IMPLANT
PADDING CAST COTTON 3X4 STRL (CAST SUPPLIES) ×1
PADDING CAST COTTON 4X4 STRL (CAST SUPPLIES) ×1
PADDING CAST COTTON 6X4 STRL (CAST SUPPLIES) ×3 IMPLANT
PIN FLUTED HEDLESS FIX 3.5X1/8 (PIN) ×1 IMPLANT
SET HNDPC FAN SPRY TIP SCT (DISPOSABLE) ×1 IMPLANT
STRIP CLOSURE SKIN 1/2X4 (GAUZE/BANDAGES/DRESSINGS) ×4 IMPLANT
SUCTION FRAZIER HANDLE 10FR (MISCELLANEOUS) ×1
SUCTION TUBE FRAZIER 10FR DISP (MISCELLANEOUS) ×1 IMPLANT
SUT MNCRL AB 3-0 PS2 18 (SUTURE) ×2 IMPLANT
SUT VIC AB 0 CT1 27 (SUTURE) ×4
SUT VIC AB 0 CT1 27XBRD ANBCTR (SUTURE) ×3 IMPLANT
SUT VIC AB 1 CT1 36 (SUTURE) ×11 IMPLANT
SUT VIC AB 2-0 CT1 27 (SUTURE) ×4
SUT VIC AB 2-0 CT1 TAPERPNT 27 (SUTURE) ×4 IMPLANT
SYR 30ML LL (SYRINGE) ×6 IMPLANT
SYR TB 1ML LUER SLIP (SYRINGE) ×2 IMPLANT
TOWEL GREEN STERILE (TOWEL DISPOSABLE) ×4 IMPLANT
TOWEL GREEN STERILE FF (TOWEL DISPOSABLE) ×4 IMPLANT
TRAY CATH 16FR W/PLASTIC CATH (SET/KITS/TRAYS/PACK) ×1 IMPLANT
WATER STERILE IRR 1000ML POUR (IV SOLUTION) IMPLANT
YANKAUER SUCT BULB TIP NO VENT (SUCTIONS) ×2 IMPLANT

## 2021-12-07 NOTE — Op Note (Unsigned)
NAMERASHAAN, WYLES MEDICAL RECORD NO: [id number] ACCOUNT NO: [id number] DATE OF BIRTH: July 13, 1965 FACILITY: [facility] LOCATION: [facility]-PERI[facility] PHYSICIAN: Yetta Barre. Marlou Sa, MD  Operative Report   DATE OF PROCEDURE: 12/07/2021  PREOPERATIVE DIAGNOSIS:  Left knee arthritis.  POSTOPERATIVE DIAGNOSIS:  Left knee arthritis.  PROCEDURE:  Left total knee replacement using Stryker press-fit Triathlon knee, size 4 femur, 5 tibia, 9 mm polyethylene deep dish insert with 35 mm press fit 3-peg patella.  SURGEON:  Yetta Barre. Marlou Sa, MD  ASSISTANT:  Annie Main.  INDICATIONS:  The patient is a 56 year old patient with end-stage left knee arthritis that has failed conservative management.  He presents now for operative management after explanation of risks and benefits.  DESCRIPTION OF PROCEDURE:  The patient was brought to the operating room where spinal anesthetic was induced.  Preoperative antibiotics administered.  Timeout was called.  The patient was given a test dose of Ancef, which he tolerated.  Left leg was then  prescrubbed with alcohol and Betadine, allowed to air dry, prepped with DuraPrep solution and draped in sterile manner.  Ioban used to cover the operative field.  The leg was elevated and exsanguinated, tourniquet was inflated.  Timeout was called.   Anterior approach to the knee was made.  Skin and subcutaneous tissue were sharply divided.  IrriSept solution utilized.  Median parapatellar arthrotomy made and marked with #1 Vicryl suture.  IrriSept solution utilized in the joint as well.  A partial  excision of the fat pad was performed.  A medial soft tissue dissection was performed, proportionate to his mild varus deformity.  Lateral patellofemoral ligament was released.  Soft tissue removed from the anterior distal femur.  Patella was everted.   Osteophytes were removed.  Anterior horn of lateral meniscus resected.  ACL incised.  Posterior retractor placed.  Intramedullary alignment, then used  to make a cut perpendicular to the mechanical axis with slight posterior slope on the tibia.  This was  measured 2 mm off the most affected medial tibial plateau.  Severe tricompartmental arthritis was present.  Bone quality, excellent.  Intramedullary alignment, then used to cut the femur in 5 degrees valgus initially 8 mm cut, then revised 2 more  millimeters for a total of a 10 mm cut.  This allowed a 9 mm spacer to sit in for the leg to achieve full extension with good stability.  Femur was sized to a size 4, cut in 3 degrees of external rotation.  Anterior, posterior and chamfer cuts with the  collaterals protected.  A tibial tray was placed, size 5, femoral trial was placed.  A 9 mm spacer placed.  Patellar cut from 26 down to 15 mm and a 3-peg patellar trial was placed.  With 9 mm spacer the patient achieved full extension, full flexion with  no liftoff, and excellent patellar tracking using no thumbs technique.  Final keel punch preparation performed on the tibia.  Trial components were removed.  Thorough irrigation performed with 3 liters of irrigating solution.  Capsule anesthetized using  a combination of Exparel, Marcaine and saline.  Tranexamic acid sponge allowed to sit in the knee for 3 minutes along with IrriSept solution-soaked sponge.  These were removed.  Irrigation of the tibial canal was performed with IrriSept and then that  was filled with vancomycin powder.  The components were then placed into position with excellent press fit obtained.  Same stability parameters were maintained with good stability to varus and valgus stress at 0, 30 and 90  degrees, full range of motion,  extension to flexion with good patellar tracking using no thumbs technique.  Tourniquet released at this time after placing the true spacer and the patellar component.  Bleeding points encountered controlled using electrocautery. Pouring irrigation utilized at this point x4 liters.  Arthrotomy was closed over a  bolster using #1  Vicryl suture.  Following prior arthrotomy closure the knee joint was again irrigated with IrriSept solution and vancomycin powder placed.  Final arthrotomy closure was then performed through that small defect.  The knee was then anesthetized  intra-articular using Marcaine, morphine, clonidine.  IrriSept solution and vancomycin powder was then placed above the arthrotomy closure.  The closure was then performed in layers using 0 Vicryl suture, 2-0 Vicryl suture, and 3-0 Monocryl with  Steri-Strips and Aquacel dressing applied.  The patient tolerated the procedure well without immediate complications.  Bulky dressing and iceman and Ace wrap applied along with knee immobilizer.  The patient tolerated the procedure well without immediate  complications, transferred to the recovery room in stable condition.  Luke's assistance was required at all times for opening, closing, retraction, drilling of the pins, sawing of the bone and placement of the spacer.  His assistance was a medical  necessity at all times during the case.   PUS D: 12/07/2021 10:51:42 am T: 12/07/2021 11:53:00 am  JOB: [id number]/ [id number]

## 2021-12-07 NOTE — Anesthesia Procedure Notes (Signed)
Spinal  Patient location during procedure: OR Start time: 12/07/2021 7:40 AM End time: 12/07/2021 7:47 AM Reason for block: surgical anesthesia Staffing Performed: anesthesiologist  Anesthesiologist: Murvin Natal, MD Preanesthetic Checklist Completed: patient identified, IV checked, risks and benefits discussed, surgical consent, monitors and equipment checked, pre-op evaluation and timeout performed Spinal Block Patient position: sitting Prep: DuraPrep Patient monitoring: cardiac monitor, continuous pulse ox and blood pressure Approach: midline Location: L4-5 Injection technique: single-shot Needle Needle type: Pencan  Needle gauge: 24 G Needle length: 9 cm Assessment Sensory level: T10 Events: CSF return Additional Notes Functioning IV was confirmed and monitors were applied. Sterile prep and drape, including hand hygiene and sterile gloves were used. The patient was positioned and the spine was prepped. The skin was anesthetized with lidocaine.  Free flow of clear CSF was obtained prior to injecting local anesthetic into the CSF.  The spinal needle aspirated freely following injection.  The needle was carefully withdrawn.  The patient tolerated the procedure well.

## 2021-12-07 NOTE — H&P (Addendum)
TOTAL KNEE ADMISSION H&P  Patient is being admitted for left total knee arthroplasty.  Subjective:  Chief Complaint:left knee pain.  HPI: Daniel Caldwell, 56 y.o. male, has a history of pain and functional disability in the left knee due to arthritis and has failed non-surgical conservative treatments for greater than 12 weeks to includeNSAID's and/or analgesics.,  Cortisone injections.  Gel injections.  Activity modification.  Physical therapy and strengthening.  Onset of symptoms was gradual, starting >10 years ago with gradually worsening course since that time. The patient noted prior procedures on the knee to include  arthroscopy on the left knee(s).  Patient currently rates pain in the left knee(s) at 9 out of 10 with activity. Patient has night pain, worsening of pain with activity and weight bearing, pain that interferes with activities of daily living, pain with passive range of motion, crepitus, and joint swelling.  Patient has evidence of subchondral sclerosis and joint space narrowing by imaging studies. This patient has had long history of nonoperative treatment and no history of DVT or pulmonary embolism. There is no active infection.  Patient Active Problem List   Diagnosis Date Noted   Recurrent right inguinal hernia 01/22/2018   Left inguinal hernia 01/22/2018   Past Medical History:  Diagnosis Date   Left inguinal hernia 01/22/2018   Mixed hyperlipidemia    Pre-diabetes    Recurrent right inguinal hernia 01/22/2018   Sleep apnea    doesn't use CPAP anymore    Past Surgical History:  Procedure Laterality Date   INGUINAL HERNIA REPAIR Bilateral 01/22/2018   Procedure: OPEN BILATERAL INGUINAL HERNIA REPAIR ERAS PATHWAY;  Surgeon: Fanny Skates, MD;  Location: [facility];  Service: General;  Laterality: Bilateral;   INSERTION OF MESH Bilateral 01/22/2018   Procedure: INSERTION OF MESH;  Surgeon: Fanny Skates, MD;  Location: [facility];   Service: General;  Laterality: Bilateral;   KNEE SURGERY      Current Facility-Administered Medications  Medication Dose Route Frequency Provider Last Rate Last Admin   lactated ringers infusion   Intravenous Continuous Ellender, Karyl Kinnier, MD       povidone-iodine (BETADINE) 7.5 % scrub   Topical Once Magnant, Charles L, PA-C       tranexamic acid (CYKLOKAPRON) 2,000 mg in sodium chloride 0.9 % 50 mL Topical Application  3,762 mg Topical To OR Marlou Sa, Tonna Corner, MD       tranexamic acid (CYKLOKAPRON) IVPB 1,000 mg  1,000 mg Intravenous To OR Magnant, Charles L, PA-C       vancomycin (VANCOCIN) IVPB 1000 mg/200 mL premix  1,000 mg Intravenous On Call to OR Magnant, Charles L, PA-C 200 mL/hr at 12/07/21 0654 1,000 mg at 12/07/21 0654   Allergies  Allergen Reactions   Penicillins Hives and Other (See Comments)    Unknown Has patient had a PCN reaction causing immediate rash, facial/tongue/throat swelling, SOB or lightheadedness with hypotension: Unknown Has patient had a PCN reaction causing severe rash involving mucus membranes or skin necrosis: Unknown Has patient had a PCN reaction that required hospitalization: Unknown Has patient had a PCN reaction occurring within the last 10 years: No If all of the above answers are "NO", then may proceed with Cephalosporin use.     Social History   Tobacco Use   Smoking status: Never   Smokeless tobacco: Former    Types: Chew   Tobacco comments:    Pt states he never chewed tobacco  Substance Use Topics  Alcohol use: Yes    Alcohol/week: 3.0 standard drinks    Types: 3 Cans of beer per week    Family History  Problem Relation Age of Onset   Colon cancer Neg Hx    Colon polyps Neg Hx    Esophageal cancer Neg Hx    Rectal cancer Neg Hx    Stomach cancer Neg Hx      Review of Systems  Musculoskeletal:  Positive for arthralgias.  All other systems reviewed and are negative.  Objective:  Physical Exam Vitals reviewed.  HENT:      Head: Normocephalic.     Nose: Nose normal.     Mouth/Throat:     Mouth: Mucous membranes are moist.  Eyes:     Pupils: Pupils are equal, round, and reactive to light.  Cardiovascular:     Rate and Rhythm: Normal rate.     Pulses: Normal pulses.  Pulmonary:     Effort: Pulmonary effort is normal.  Abdominal:     General: Abdomen is flat.  Musculoskeletal:     Cervical back: Normal range of motion.  Skin:    General: Skin is warm.     Capillary Refill: Capillary refill takes less than 2 seconds.  Neurological:     General: No focal deficit present.     Mental Status: He is alert.  Psychiatric:        Mood and Affect: Mood normal.  Left knee rom 5 to 115 with stable collateral and cruciate ligamnts, dp 2/4 ankle df intact,skin ok with no breakdown but slight lateral irritation from brace wear away from planned incision, extensor mechanism intact  Vital signs in last 24 hours: Temp:  [98.1 F (36.7 C)] 98.1 F (36.7 C) (06/06 0541) Pulse Rate:  [59] 59 (06/06 0541) Resp:  [17] 17 (06/06 0541) BP: (145)/(96) 145/96 (06/06 0541) SpO2:  [100 %] 100 % (06/06 0541) Weight:  [74.8 kg] 74.8 kg (06/06 0541)  Labs:   Estimated body mass index is 23.68 kg/m as calculated from the following:   Height as of this encounter: '5\' 10"'$  (1.778 m).   Weight as of this encounter: 74.8 kg.   Imaging Review Plain radiographs demonstrate moderate degenerative joint disease of the left knee(s). The overall alignment isneutral. The bone quality appears to be good for age and reported activity level.      Assessment/Plan:  End stage arthritis, left knee   The patient history, physical examination, clinical judgment of the provider and imaging studies are consistent with end stage degenerative joint disease of the left knee(s) and total knee arthroplasty is deemed medically necessary. The treatment options including medical management, injection therapy arthroscopy and arthroplasty were  discussed at length. The risks and benefits of total knee arthroplasty were presented and reviewed. The risks due to aseptic loosening, infection, stiffness, patella tracking problems, thromboembolic complications and other imponderables were discussed. The patient acknowledged the explanation, agreed to proceed with the plan and consent was signed. Patient is being admitted for inpatient treatment for surgery, pain control, PT, OT, prophylactic antibiotics, VTE prophylaxis, progressive ambulation and ADL's and discharge planning. The patient is planning to be discharged home with home health services     Patient's anticipated LOS is less than 2 midnights, meeting these requirements: - Younger than 64 - Lives within 1 hour of care - Has a competent adult at home to recover with post-op recover - NO history of  - Chronic pain requiring opiods  - Diabetes  -  Coronary Artery Disease  - Heart failure  - Heart attack  - Stroke  - DVT/VTE  - Cardiac arrhythmia  - Respiratory Failure/COPD  - Renal failure  - Anemia  - Advanced Liver disease

## 2021-12-07 NOTE — Progress Notes (Signed)
Orthopedic Tech Progress Note Patient Details:  Daniel Caldwell 1965/07/12 [id number] Out of bone foams at the moment CPM Left Knee CPM Left Knee: On Left Knee Flexion (Degrees): 10 Left Knee Extension (Degrees): 40  Post Interventions Patient Tolerated: Well  Alba Perillo A Zackrey Dyar 12/07/2021, 11:28 AM

## 2021-12-07 NOTE — Transfer of Care (Signed)
Immediate Anesthesia Transfer of Care Note  Patient: Daniel Caldwell  Procedure(s) Performed: LEFT TOTAL KNEE ARTHROPLASTY (Left: Knee)  Patient Location: PACU  Anesthesia Type:GA combined with regional for post-op pain  Level of Consciousness: drowsy, patient cooperative and responds to stimulation  Airway & Oxygen Therapy: Patient Spontanous Breathing and Patient connected to nasal cannula oxygen  Post-op Assessment: Report given to RN and Post -op Vital signs reviewed and stable  Post vital signs: Reviewed and stable  Last Vitals:  Vitals Value Taken Time  BP 123/82 12/07/21 1020  Temp    Pulse 52 12/07/21 1021  Resp 14 12/07/21 1021  SpO2 97 % 12/07/21 1021  Vitals shown include unvalidated device data.  Last Pain:  Vitals:   12/07/21 0612  TempSrc:   PainSc: 0-No pain         Complications: No notable events documented.

## 2021-12-07 NOTE — Progress Notes (Signed)
Bladder scan shows 300 ml, PA ordered IN and OUT cath, patient took the last chance to pee and it did work, he peed 200 ml.

## 2021-12-07 NOTE — Brief Op Note (Signed)
   12/07/2021  10:45 AM  PATIENT:  Benedetto Coons Donze  56 y.o. male  PRE-OPERATIVE DIAGNOSIS:  left knee osteoarthritis  POST-OPERATIVE DIAGNOSIS:  left knee osteoarthritis  PROCEDURE:  Procedure(s): LEFT TOTAL KNEE ARTHROPLASTY  SURGEON:  Surgeon(s): Marlou Sa, Tonna Corner, MD  ASSISTANT: Magnant PA  ANESTHESIA:   spinal  EBL: 50 ml    Total I/O In: 1350 [I.V.:1300; IV Piggyback:50] Out: 650 [Urine:600; Blood:50]  BLOOD ADMINISTERED: none  DRAINS: none   LOCAL MEDICATIONS USED: Marcaine morphine clonidine Exparel vancomycin powder  SPECIMEN:  No Specimen  COUNTS:  YES  TOURNIQUET:   Total Tourniquet Time Documented: Thigh (Left) - 71 minutes Total: Thigh (Left) - 71 minutes   DICTATION: .Other Dictation: Dictation Number: Patient Machi Whittaker  [id number]  PLAN OF CARE: Admit for overnight observation  PATIENT DISPOSITION:  PACU - hemodynamically stable

## 2021-12-07 NOTE — Anesthesia Procedure Notes (Signed)
Date/Time: 12/07/2021 7:40 AM Performed by: Michele Rockers, CRNA Pre-anesthesia Checklist: Patient identified, Emergency Drugs available, Suction available, Timeout performed and Patient being monitored Patient Re-evaluated:Patient Re-evaluated prior to induction Oxygen Delivery Method: Simple face mask

## 2021-12-07 NOTE — Anesthesia Procedure Notes (Signed)
Anesthesia Regional Block: Adductor canal block   Pre-Anesthetic Checklist: , timeout performed,  Correct Patient, Correct Site, Correct Laterality,  Correct Procedure,, site marked,  Risks and benefits discussed,  Surgical consent,  Pre-op evaluation,  At surgeon's request and post-op pain management  Laterality: Left  Prep: chloraprep       Needles:  Injection technique: Single-shot  Needle Type: Echogenic Stimulator Needle     Needle Length: 9cm  Needle Gauge: 21     Additional Needles:   Procedures:,,,, ultrasound used (permanent image in chart),,    Narrative:  Start time: 12/07/2021 6:55 AM End time: 12/07/2021 7:05 AM Injection made incrementally with aspirations every 5 mL.  Performed by: Personally  Anesthesiologist: Murvin Natal, MD  Additional Notes: Functioning IV was confirmed and monitors were applied. A time-out was performed. Hand hygiene and sterile gloves were used. The thigh was placed in a frog-leg position and prepped in a sterile fashion. A 57m 21ga Arrow echogenic stimulator needle was placed using ultrasound guidance.  Negative aspiration and negative test dose prior to incremental administration of local anesthetic. The patient tolerated the procedure well.

## 2021-12-07 NOTE — Evaluation (Signed)
Physical Therapy Evaluation Patient Details Name: Daniel Caldwell MRN: [id number] DOB: 07-01-1966 Today's Date: 12/07/2021  History of Present Illness  Pt is a 56 y/o s/p L TKA. PMH includes sleep apnea and pre-diabetes.  Clinical Impression  Pt admitted secondary to problem above with deficits below. Pt requiring min to min guard A for mobility tasks using RW this session. Pt with stomachache which limited mobility tolerance this session. Reviewed knee precautions with pt. Reports family can assist as needed. Will continue to follow acutely.        Recommendations for follow up therapy are one component of a multi-disciplinary discharge planning process, led by the attending physician.  Recommendations may be updated based on patient status, additional functional criteria and insurance authorization.  Follow Up Recommendations Follow physician's recommendations for discharge plan and follow up therapies    Assistance Recommended at Discharge Intermittent Supervision/Assistance  Patient can return home with the following  Help with stairs or ramp for entrance;Assist for transportation;Assistance with cooking/housework    Equipment Recommendations None recommended by PT  Recommendations for Other Services       Functional Status Assessment Patient has had a recent decline in their functional status and demonstrates the ability to make significant improvements in function in a reasonable and predictable amount of time.     Precautions / Restrictions Precautions Precautions: Knee Precaution Booklet Issued: No Precaution Comments: Verbally reviewed knee precautions. Required Braces or Orthoses: Knee Immobilizer - Left Restrictions Weight Bearing Restrictions: Yes LLE Weight Bearing: Weight bearing as tolerated      Mobility  Bed Mobility Overal bed mobility: Needs Assistance Bed Mobility: Supine to Sit, Sit to Supine     Supine to sit: Min assist Sit to supine: Min assist    General bed mobility comments: Assist for LLE management    Transfers Overall transfer level: Needs assistance Equipment used: Rolling walker (2 wheels) Transfers: Sit to/from Stand Sit to Stand: Min assist           General transfer comment: Min A for lift assist and steadying    Ambulation/Gait Ambulation/Gait assistance: Min guard   Assistive device: Rolling walker (2 wheels)         General Gait Details: Able to take side steps at EOB. Pt with increased stomachache, so further mobility limited. Min guard for safety with limited weightshift to LLE.  Stairs            Wheelchair Mobility    Modified Rankin (Stroke Patients Only)       Balance Overall balance assessment: Needs assistance Sitting-balance support: No upper extremity supported, Feet supported Sitting balance-Leahy Scale: Good     Standing balance support: Bilateral upper extremity supported Standing balance-Leahy Scale: Poor Standing balance comment: Reliant on BUE support                             Pertinent Vitals/Pain Pain Assessment Pain Assessment: Faces Faces Pain Scale: Hurts little more Pain Location: L knee Pain Descriptors / Indicators: Grimacing, Guarding Pain Intervention(s): Monitored during session, Limited activity within patient's tolerance, Repositioned    Home Living Family/patient expects to be discharged to:: Private residence Living Arrangements: Spouse/significant other;Children Available Help at Discharge: Family Type of Home: Mobile home Home Access: Stairs to enter Entrance Stairs-Rails: Left Entrance Stairs-Number of Steps: 6   Home Layout: One level Home Equipment: Advice worker (2 wheels);BSC/3in1;Cane - single point      Prior Function Prior  Level of Function : Independent/Modified Independent             Mobility Comments: Was using cane for ambulation       Hand Dominance        Extremity/Trunk Assessment    Upper Extremity Assessment Upper Extremity Assessment: Overall WFL for tasks assessed    Lower Extremity Assessment Lower Extremity Assessment: LLE deficits/detail LLE Deficits / Details: Deficits consistent with post op pain and weakness    Cervical / Trunk Assessment Cervical / Trunk Assessment: Normal  Communication   Communication: No difficulties  Cognition Arousal/Alertness: Awake/alert Behavior During Therapy: WFL for tasks assessed/performed Overall Cognitive Status: Within Functional Limits for tasks assessed                                          General Comments General comments (skin integrity, edema, etc.): Reviewed performing 20 ankle pumps every hour    Exercises     Assessment/Plan    PT Assessment Patient needs continued PT services  PT Problem List Decreased strength;Decreased balance;Decreased activity tolerance;Decreased range of motion;Decreased mobility;Decreased knowledge of use of DME;Decreased knowledge of precautions;Pain       PT Treatment Interventions DME instruction;Gait training;Stair training;Functional mobility training;Therapeutic activities;Therapeutic exercise;Balance training;Patient/family education    PT Goals (Current goals can be found in the Care Plan section)  Acute Rehab PT Goals Patient Stated Goal: to feel better PT Goal Formulation: With patient Time For Goal Achievement: 12/21/21 Potential to Achieve Goals: Good    Frequency 7X/week     Co-evaluation               AM-PAC PT "6 Clicks" Mobility  Outcome Measure Help needed turning from your back to your side while in a flat bed without using bedrails?: A Little Help needed moving from lying on your back to sitting on the side of a flat bed without using bedrails?: A Little Help needed moving to and from a bed to a chair (including a wheelchair)?: A Little Help needed standing up from a chair using your arms (e.g., wheelchair or bedside  chair)?: A Little Help needed to walk in hospital room?: A Little Help needed climbing 3-5 steps with a railing? : A Lot 6 Click Score: 17    End of Session Equipment Utilized During Treatment: Gait belt Activity Tolerance: Patient tolerated treatment well Patient left: in bed;with nursing/sitter in room (in bed in PACU) Nurse Communication: Mobility status PT Visit Diagnosis: Other abnormalities of gait and mobility (R26.89);Difficulty in walking, not elsewhere classified (R26.2);Pain Pain - Right/Left: Left Pain - part of body: Knee    Time: 2620-3559 PT Time Calculation (min) (ACUTE ONLY): 19 min   Charges:   PT Evaluation $PT Eval Low Complexity: 1 Low          Lou Miner, DPT  Acute Rehabilitation Services  Office: [phone]   Rudean Hitt 12/07/2021, 3:41 PM

## 2021-12-08 ENCOUNTER — Other Ambulatory Visit: Payer: Self-pay | Admitting: Surgical

## 2021-12-08 ENCOUNTER — Telehealth: Payer: Self-pay | Admitting: Orthopedic Surgery

## 2021-12-08 ENCOUNTER — Encounter (HOSPITAL_COMMUNITY): Payer: Self-pay | Admitting: Orthopedic Surgery

## 2021-12-08 DIAGNOSIS — M1712 Unilateral primary osteoarthritis, left knee: Secondary | ICD-10-CM | POA: Diagnosis not present

## 2021-12-08 MED ORDER — ASPIRIN 81 MG PO CHEW: 81.0000 mg | CHEWABLE_TABLET | Freq: Two times a day (BID) | ORAL | 0 refills | Status: AC

## 2021-12-08 MED ORDER — SCOPOLAMINE 1 MG/3DAYS TD PT72
MEDICATED_PATCH | TRANSDERMAL | Status: AC
  Filled 2021-12-08: qty 1

## 2021-12-08 MED ORDER — OXYCODONE HCL 5 MG PO TABS: 5.0000 mg | ORAL_TABLET | ORAL | 0 refills | Status: DC | PRN

## 2021-12-08 MED ORDER — CELECOXIB 100 MG PO CAPS: 100.0000 mg | ORAL_CAPSULE | Freq: Two times a day (BID) | ORAL | 0 refills | Status: DC

## 2021-12-08 MED ORDER — CEPHALEXIN 500 MG PO CAPS: 1000.0000 mg | ORAL_CAPSULE | Freq: Once | ORAL | 0 refills | Status: AC

## 2021-12-08 MED ORDER — METHOCARBAMOL 500 MG PO TABS: 500.0000 mg | ORAL_TABLET | Freq: Three times a day (TID) | ORAL | 1 refills | Status: AC | PRN

## 2021-12-08 MED ORDER — CEFAZOLIN SODIUM-DEXTROSE 1-4 GM/50ML-% IV SOLN
1.0000 g | Freq: Three times a day (TID) | INTRAVENOUS | Status: DC
  Administered 2021-12-08: 1 g via INTRAVENOUS
  Filled 2021-12-08: qty 50

## 2021-12-08 NOTE — Anesthesia Postprocedure Evaluation (Signed)
Anesthesia Post Note  Patient: Daniel Caldwell  Procedure(s) Performed: LEFT TOTAL KNEE ARTHROPLASTY (Left: Knee)     Patient location during evaluation: PACU Anesthesia Type: Regional Level of consciousness: awake and alert Pain management: pain level controlled Vital Signs Assessment: post-procedure vital signs reviewed and stable Respiratory status: spontaneous breathing and respiratory function stable Cardiovascular status: blood pressure returned to baseline and stable Postop Assessment: spinal receding Anesthetic complications: no   No notable events documented.  Last Vitals:  Vitals:   12/08/21 0615 12/08/21 0754  BP: 101/60 (!) 104/56  Pulse: (!) 53 67  Resp:  20  Temp: 36.9 C 37.1 C  SpO2: 96% 93%    Last Pain:  Vitals:   12/08/21 0930  TempSrc:   PainSc: 6                  Tiajuana Amass

## 2021-12-08 NOTE — Telephone Encounter (Signed)
Please sent the [facility] [address] [facility] [place] [facility]

## 2021-12-08 NOTE — Telephone Encounter (Signed)
IC advised.  

## 2021-12-08 NOTE — Progress Notes (Signed)
Orthopedic Tech Progress Note Patient Details:  Daniel Caldwell 1966/03/19 [id number] 5'10'' crutches were delivered to the patient's room.  Ortho Devices Type of Ortho Device: Crutches Ortho Device/Splint Location: LLE Ortho Device/Splint Interventions: Ordered, Adjustment   Post Interventions Patient Tolerated: Well  [name] E [name] 12/08/2021, 10:35 AM

## 2021-12-08 NOTE — Progress Notes (Signed)
Physical Therapy Treatment Patient Details Name: Daniel Caldwell MRN: [id number] DOB: [birth year] Today's Date: 12/08/2021   History of Present Illness Pt is a 56 y/o s/p L TKA. PMH includes sleep apnea and pre-diabetes.    PT Comments    Pt demos improved strength and activity tolerance during AM tx.  Able to transfer OOB, perform sit > stand and amb in halls with CGA.  Pt also demos good tolerance to initiation of stair training using single rail and single crutch to mimic home stairs.  Pt needs greatest assist to inc weightbearing through operative LE as pt has tendency to switch to 3 pt gait instead of practicing WBAT through L LE.  Pt demos significant lack of ROM at this point in time and is encouraged to inc knee extension by resting with heel elevated with nothing under posterior knee.  Pt safe to D/C home today with assist from spouse.   Recommendations for follow up therapy are one component of a multi-disciplinary discharge planning process, led by the attending physician.  Recommendations may be updated based on patient status, additional functional criteria and insurance authorization.  Follow Up Recommendations  Follow physician's recommendations for discharge plan and follow up therapies     Assistance Recommended at Discharge Intermittent Supervision/Assistance  Patient can return home with the following A little help with walking and/or transfers;A little help with bathing/dressing/bathroom;Assistance with cooking/housework;Help with stairs or ramp for entrance;Assist for transportation   Equipment Recommendations  Crutches (Needs crutch for stair negotiation)    Recommendations for Other Services       Precautions / Restrictions Precautions Precautions: Knee Required Braces or Orthoses: Knee Immobilizer - Left Restrictions LLE Weight Bearing: Weight bearing as tolerated     Mobility  Bed Mobility Overal bed mobility: Modified Independent Bed Mobility: Supine to  Sit, Sit to Supine     Supine to sit: Modified independent (Device/Increase time) Sit to supine: Modified independent (Device/Increase time)   General bed mobility comments: Pt transfers to EOB wtih mod I and use of bed rail.   On return back to bed, pt states he needs assist with his L LE, but PT teaches him technique of hooking strong R LE underneath L LE to lift LE back to bed.  Pt demos mod I with activity once instructed. Patient Response: Cooperative  Transfers Overall transfer level: Modified independent Equipment used: Rolling walker (2 wheels) Transfers: Sit to/from Stand Sit to Stand: Min guard           General transfer comment: VC's for safety with hand placement during transition.  Pt able to transition from low surface with CGA only.  Takes inc time to complete.  Demos good standing balance once up but holds foot in PF.  Inc cueing to try to encourage flat foot.    Ambulation/Gait Ambulation/Gait assistance: Supervision Gait Distance (Feet): 100 Feet Assistive device: Rolling walker (2 wheels) Gait Pattern/deviations: Step-to pattern       General Gait Details: Pt amb in halls with supervision and VCs for appropriate gait sequencing.  Pt has tendency to try to convert to 3 pt gait instead of weightbearing through L LE and needs inc VCs to improve weightbearing.  Pt able to demo proper technique when reminded.  Cadence is slow but no LOB with activity.   Stairs Stairs: Yes Stairs assistance: Min guard Stair Management: One rail Left Number of Stairs: 6 General stair comments: Pt attempts to negotiate step with single L sided rail and HHA on  R side and is unable to complete task.  Pt then trials stairs with 2 rails (despite only having one rail at home) and is able to properly ascend and descend 3 steps with CGA. Pt is given single axillary crutch to trial on R side during ascent and L side during descent and is able to demo activity with CGA.   Wheelchair  Mobility    Modified Rankin (Stroke Patients Only)       Balance Overall balance assessment: Modified Independent           Standing balance-Leahy Scale: Fair                              Cognition Arousal/Alertness: Awake/alert Behavior During Therapy: WFL for tasks assessed/performed Overall Cognitive Status: Within Functional Limits for tasks assessed                                 General Comments: Pt is supine in bed when PT arrives with CPM in place.  Agreeable to work with PT.        Exercises Total Joint Exercises Goniometric ROM: Flex: 58 deg, Ext: -28 deg    General Comments General comments (skin integrity, edema, etc.): Pt demos lack of knee ext and is encouraged to rest with foot on elvated surface to stretch posterior knee.  Demos understanding.      Pertinent Vitals/Pain Pain Assessment Pain Assessment: 0-10 Pain Score: 4  Pain Location: L knee Pain Descriptors / Indicators: Aching, Discomfort Pain Intervention(s): Monitored during session    Home Living                          Prior Function            PT Goals (current goals can now be found in the care plan section) Progress towards PT goals: Progressing toward goals    Frequency    7X/week      PT Plan Current plan remains appropriate    Co-evaluation              AM-PAC PT "6 Clicks" Mobility   Outcome Measure  Help needed turning from your back to your side while in a flat bed without using bedrails?: A Little Help needed moving from lying on your back to sitting on the side of a flat bed without using bedrails?: A Little Help needed moving to and from a bed to a chair (including a wheelchair)?: A Little Help needed standing up from a chair using your arms (e.g., wheelchair or bedside chair)?: A Little Help needed to walk in hospital room?: A Little Help needed climbing 3-5 steps with a railing? : A Little 6 Click Score: 18     End of Session Equipment Utilized During Treatment: Gait belt Activity Tolerance: Patient tolerated treatment well Patient left: in bed;with call bell/phone within reach Nurse Communication: Mobility status       Time: 4098-1191 PT Time Calculation (min) (ACUTE ONLY): 31 min  Charges:  $Gait Training: 8-22 mins $Therapeutic Activity: 8-22 mins                     Daniel Caldwell A. Melchizedek Espinola, PT, DPT Acute Rehabilitation Services Office: [place] 12/08/2021, 9:40 AM

## 2021-12-08 NOTE — Progress Notes (Signed)
  Subjective: Patient stable.  Pain controlled.  CPM at 39.   Objective: Vital signs in last 24 hours: Temp:  [97.1 F (36.2 C)-98.6 F (37 C)] 98.4 F (36.9 C) (06/07 0615) Pulse Rate:  [41-60] 53 (06/07 0615) Resp:  [10-20] 20 (06/06 1458) BP: (94-123)/(60-82) 101/60 (06/07 0615) SpO2:  [95 %-100 %] 96 % (06/07 0615)  Intake/Output from previous day: 06/06 0701 - 06/07 0700 In: 1350 [I.V.:1300; IV Piggyback:50] Out: 6010 [Urine:1800; Blood:50] Intake/Output this shift: No intake/output data recorded.  Exam:  Sensation intact distally Dorsiflexion/Plantar flexion intact No cellulitis present  Labs: No results for input(s): HGB in the last 72 hours. No results for input(s): WBC, RBC, HCT, PLT in the last 72 hours. No results for input(s): NA, K, CL, CO2, BUN, CREATININE, GLUCOSE, CALCIUM in the last 72 hours. No results for input(s): LABPT, INR in the last 72 hours.  Assessment/Plan: Plan at this time is to see how he does with physical therapy.  Ace wrap removed today.  CPM machine 3 times a day.  If he does well I could see him being able to go home later this afternoon.  Otherwise we will plan for discharge tomorrow.   Daniel Caldwell 12/08/2021, 7:34 AM

## 2021-12-08 NOTE — Telephone Encounter (Signed)
Sent in pain prescription to new pharmacy

## 2021-12-08 NOTE — Progress Notes (Signed)
Discussed with Lurena Joiner patient's positive PT session and after consulting Dr. Marlou Sa decided patient is ready for discharge. Medication will be called to pharmacy of choice. Home Health PT and equipment already set up. Verbalized understanding of discharge instructions.

## 2021-12-08 NOTE — TOC Transition Note (Signed)
Transition of Care [facility]) - CM/SW Discharge Note   Patient Details  Name: Daniel Caldwell MRN: [id number] Date of Birth: 11/15/1965  [facility] CM/SW Contact:  Sharin Mons, RN Phone Number: 12/08/2021, 10:15 AM   Clinical Narrative:    Patient will DC to: home Anticipated DC date: 12/08/2021 Family notified:yes Transport by:  car         - s/p L TKA Per MD patient ready for DC today after clearance from therapy. RN, patient, patient's family, and [facility] notified of DC.  Pt states wife and daughter to assist with care once d/c. Pt without Rx med concerns.  DME need noted per PT: Crutches (Needs crutch for stair negotiation). Order placed. Ortho tech to provide pt with crutches prior to d/c.  Pt states has RW, BSC and CPM @ home.  Post hospital f/u noted on AVS. Wife to provide transportation to home.  RNCM will sign off for now as intervention is no longer needed. Please consult [place] again if new needs arise.    Final next level of care: Home w Home Health Services Barriers to Discharge: No Barriers Identified   Patient Goals and CMS Choice     Choice offered to / list presented to : Patient  Discharge Placement                       Discharge Plan and Services                DME Arranged:  (Pt without  DME , setup by MD office prior to surgery)         HH Arranged: PT HH Agency: [facility] Date [place]: 12/08/21 Time [place]: 1007 Representative spoke with at [place]: [place] ([place]) Interventions     Readmission Risk Interventions     View : No data to display.

## 2021-12-08 NOTE — Progress Notes (Signed)
Physical Therapy Treatment Patient Details Name: Daniel Caldwell MRN: [id number] DOB: [birth year] Today's Date: 12/08/2021   History of Present Illness Pt is a 56 y/o s/p L TKA. PMH includes sleep apnea and pre-diabetes.    PT Comments    Pt demos improved gait sequencing and weightbearing through L LE compared to AM treatment.  Able to amb inc distance in halls without difficulty.  Pt is educated on LE therex and demos understanding.  Provided with strap to assist with activity at home.  Per NSG, pt to D/C after therapy tx.  Safe to return home with assist from family, RW and HHPT.   Recommendations for follow up therapy are one component of a multi-disciplinary discharge planning process, led by the attending physician.  Recommendations may be updated based on patient status, additional functional criteria and insurance authorization.  Follow Up Recommendations  Follow physician's recommendations for discharge plan and follow up therapies     Assistance Recommended at Discharge Set up Supervision/Assistance  Patient can return home with the following A little help with walking and/or transfers;A little help with bathing/dressing/bathroom;Assistance with cooking/housework;Assist for transportation;Help with stairs or ramp for entrance   Equipment Recommendations  Rolling walker (2 wheels)    Recommendations for Other Services       Precautions / Restrictions Precautions Precautions: Knee Required Braces or Orthoses: Knee Immobilizer - Left Restrictions LLE Weight Bearing: Weight bearing as tolerated     Mobility  Bed Mobility Overal bed mobility: Modified Independent Bed Mobility: Supine to Sit, Sit to Supine     Supine to sit: Modified independent (Device/Increase time) Sit to supine: Modified independent (Device/Increase time)   General bed mobility comments: Pt able to complete transfer to EOB and back to bed with mod I.  Uses technique taught earlier today to lift  operative extremity back to bed. Patient Response: Cooperative  Transfers Overall transfer level: Modified independent Equipment used: Rolling walker (2 wheels) Transfers: Sit to/from Stand Sit to Stand: Modified independent (Device/Increase time)           General transfer comment: Completes sit > stand without difficulty with use of RW.  Demos good standing balance.    Ambulation/Gait Ambulation/Gait assistance: Min guard Gait Distance (Feet): 130 Feet Assistive device: Rolling walker (2 wheels) Gait Pattern/deviations: Step-to pattern       General Gait Details: Amb in halls with CGA for safety.  Demos improved weightbearing through L LE with activity and good carryover of appropriate gait sequencing.   Stairs Stairs: Yes Stairs assistance: Min guard Stair Management: One rail Left Number of Stairs: 6 General stair comments: Pt attempts to negotiate step with single L sided rail and HHA on R side and is unable to complete task.  Pt then trials stairs with 2 rails (despite only having one rail at home) and is able to properly ascend and descend 3 steps with CGA. Pt is given single axillary crutch to trial on R side during ascent and L side during descent and is able to demo activity with CGA.   Wheelchair Mobility    Modified Rankin (Stroke Patients Only)       Balance Overall balance assessment: Modified Independent           Standing balance-Leahy Scale: Fair                              Cognition Arousal/Alertness: Awake/alert Behavior During Therapy: WFL for tasks assessed/performed Overall  Cognitive Status: Within Functional Limits for tasks assessed                                 General Comments: Pt is supine in bed with CPM on when PT arrives.  Family present in room.  Agreeable to amb with PT.        Exercises Total Joint Exercises Ankle Circles/Pumps: AROM, Both, 5 reps Heel Slides: AAROM, 5 reps, Left Hip  ABduction/ADduction: AAROM, Left, 5 reps Straight Leg Raises: AAROM, Left, 5 reps Long Arc Quad: AAROM, Left, 5 reps Goniometric ROM: Flex: 58 deg, Ext: -28 deg Marching in Standing: AAROM, Left, Seated, 5 reps Other Exercises Other Exercises: Educated on LE therex and use of strap to assist wtih activity.  Provided with strap.  Demos understanding.    General Comments General comments (skin integrity, edema, etc.): Pt demos lack of knee ext and is encouraged to rest with foot on elvated surface to stretch posterior knee.  Demos understanding.      Pertinent Vitals/Pain Pain Assessment Pain Assessment: Faces Pain Score: 4  Faces Pain Scale: Hurts little more Pain Location: L knee Pain Descriptors / Indicators: Aching, Discomfort, Tender Pain Intervention(s): Limited activity within patient's tolerance, Monitored during session    Home Living                          Prior Function            PT Goals (current goals can now be found in the care plan section) Progress towards PT goals: Progressing toward goals    Frequency    7X/week      PT Plan Current plan remains appropriate    Co-evaluation              AM-PAC PT "6 Clicks" Mobility   Outcome Measure  Help needed turning from your back to your side while in a flat bed without using bedrails?: A Little Help needed moving from lying on your back to sitting on the side of a flat bed without using bedrails?: A Little Help needed moving to and from a bed to a chair (including a wheelchair)?: None Help needed standing up from a chair using your arms (e.g., wheelchair or bedside chair)?: None Help needed to walk in hospital room?: A Little Help needed climbing 3-5 steps with a railing? : A Little 6 Click Score: 20    End of Session Equipment Utilized During Treatment: Gait belt Activity Tolerance: Patient tolerated treatment well Patient left: in bed;with family/visitor present Nurse  Communication: Mobility status       Time: 5885-0277 PT Time Calculation (min) (ACUTE ONLY): 23 min  Charges:  $Gait Training: 8-22 mins $Therapeutic Exercise: 8-22 mins $Therapeutic Activity: 8-22 mins                     Daniel Caldwell A. Talib Headley, PT, DPT Acute Rehabilitation Services Office: [place] 12/08/2021, 12:43 PM

## 2021-12-09 ENCOUNTER — Telehealth: Payer: Self-pay | Admitting: Orthopedic Surgery

## 2021-12-09 ENCOUNTER — Other Ambulatory Visit: Payer: Self-pay | Admitting: Surgical

## 2021-12-09 MED ORDER — OXYCODONE HCL 5 MG PO TABS: 5.0000 mg | ORAL_TABLET | ORAL | 0 refills | Status: DC | PRN

## 2021-12-09 NOTE — Telephone Encounter (Signed)
Patient called advised he located a pharmacy that have Oxycodone in stock. [facility] on [address] in [place]. Patient said he is in a lot of pain and was told the [facility] [address] would not [facility] the medication in stock until Saturday.  Patient said he can't wait that long without pain medication.   The number to contact patient is [phone]

## 2021-12-09 NOTE — Telephone Encounter (Signed)
IC advise.

## 2021-12-09 NOTE — Telephone Encounter (Signed)
Sent to new pharmacy

## 2021-12-13 ENCOUNTER — Telehealth: Payer: Self-pay | Admitting: Orthopedic Surgery

## 2021-12-13 NOTE — Telephone Encounter (Signed)
Has been taking stool softener and started miralax yesterday-once yesterday and once today. States he does have slight abdominal pain but not severe. Denies any N/V

## 2021-12-13 NOTE — Telephone Encounter (Signed)
Patient called advised he has not had a bowel movement since 12/07/2021. Patient asked what can he take to help?  The number to contact patient is [phone]

## 2021-12-14 NOTE — Telephone Encounter (Signed)
Called and spoke with [place], recommended Miralax BID and discontinue opioid pain medication until he is having regular BM's. No significant abd pain, no nausea vomiting.  Has healthy appetite and is passing gas

## 2021-12-22 ENCOUNTER — Ambulatory Visit (INDEPENDENT_AMBULATORY_CARE_PROVIDER_SITE_OTHER): Payer: BC Managed Care – PPO | Admitting: Orthopedic Surgery

## 2021-12-22 ENCOUNTER — Encounter: Payer: Self-pay | Admitting: Orthopedic Surgery

## 2021-12-22 ENCOUNTER — Ambulatory Visit: Payer: Self-pay

## 2021-12-22 DIAGNOSIS — Z96652 Presence of left artificial knee joint: Secondary | ICD-10-CM

## 2021-12-22 NOTE — Progress Notes (Signed)
Post-Op Visit Note   Patient: Daniel Caldwell           Date of Birth: 02/24/1966           MRN: [id number] Visit Date: 12/22/2021 PCP: Eber Hong, MD   Assessment & Plan:  Chief Complaint:  Chief Complaint  Patient presents with   Left Knee - Routine Post Op    12/07/21 left TKA   Visit Diagnoses:  1. S/P total knee arthroplasty, left     Plan: Daniel Caldwell is a 56 year old patient is now 2 weeks out left total knee replacement.  On examination he lacks about 10 degrees of full extension and can flex to about 50 degrees goal.  Therapist as given to 70 degrees.  He is having some decreased quad activation as well.  Cannot do a straight leg things yet.  No calf tenderness negative Homans.  Radiographs look good.  Plan at this time is physical therapy to start and continue the CPM machine.  We will see him back in 2 weeks to make sure he is making progress towards getting 90 degrees of flexion.  He is only taking oxycodone at night.  He did have an episode of rectal bleeding that she has had in the past associated with hemorrhoids.  Painful.  Advised him to seek medical attention from his primary care provider for this problem.  He states that he has had this before and is not too concerned about it but if it continues I strongly encouraged him to see his primary care provider.  Follow-Up Instructions: Return in about 2 weeks (around 01/05/2022).   Orders:  Orders Placed This Encounter  Procedures   XR Knee 1-2 Views Left   Ambulatory referral to Physical Therapy   No orders of the defined types were placed in this encounter.   Imaging: No results found.  PMFS History: Patient Active Problem List   Diagnosis Date Noted   S/P total knee arthroplasty, left 12/07/2021   Recurrent right inguinal hernia 01/22/2018   Left inguinal hernia 01/22/2018   Past Medical History:  Diagnosis Date   Left inguinal hernia 01/22/2018   Mixed hyperlipidemia    Pre-diabetes    Recurrent right  inguinal hernia 01/22/2018   Sleep apnea    doesn't use CPAP anymore    Family History  Problem Relation Age of Onset   Colon cancer Neg Hx    Colon polyps Neg Hx    Esophageal cancer Neg Hx    Rectal cancer Neg Hx    Stomach cancer Neg Hx     Past Surgical History:  Procedure Laterality Date   INGUINAL HERNIA REPAIR Bilateral 01/22/2018   Procedure: OPEN BILATERAL INGUINAL HERNIA REPAIR ERAS PATHWAY;  Surgeon: Fanny Skates, MD;  Location: [facility];  Service: General;  Laterality: Bilateral;   INSERTION OF MESH Bilateral 01/22/2018   Procedure: INSERTION OF MESH;  Surgeon: Fanny Skates, MD;  Location: [facility];  Service: General;  Laterality: Bilateral;   KNEE SURGERY     TOTAL KNEE ARTHROPLASTY Left 12/07/2021   Procedure: LEFT TOTAL KNEE ARTHROPLASTY;  Surgeon: Meredith Pel, MD;  Location: [facility];  Service: Orthopedics;  Laterality: Left;   Social History   Occupational History   Not on file  Tobacco Use   Smoking status: Never   Smokeless tobacco: Former    Types: Chew   Tobacco comments:    Pt states he never chewed tobacco  Vaping Use  Vaping Use: Never used  Substance and Sexual Activity   Alcohol use: Yes    Alcohol/week: 3.0 standard drinks of alcohol    Types: 3 Cans of beer per week   Drug use: No   Sexual activity: Not on file

## 2021-12-27 ENCOUNTER — Telehealth: Payer: Self-pay | Admitting: Orthopedic Surgery

## 2021-12-29 ENCOUNTER — Other Ambulatory Visit: Payer: Self-pay

## 2021-12-29 ENCOUNTER — Ambulatory Visit (INDEPENDENT_AMBULATORY_CARE_PROVIDER_SITE_OTHER): Payer: BC Managed Care – PPO | Admitting: Physical Therapy

## 2021-12-29 ENCOUNTER — Encounter: Payer: Self-pay | Admitting: Physical Therapy

## 2021-12-29 DIAGNOSIS — R600 Localized edema: Secondary | ICD-10-CM

## 2021-12-29 DIAGNOSIS — R2689 Other abnormalities of gait and mobility: Secondary | ICD-10-CM | POA: Diagnosis not present

## 2021-12-29 DIAGNOSIS — M25562 Pain in left knee: Secondary | ICD-10-CM | POA: Diagnosis not present

## 2021-12-29 DIAGNOSIS — M25662 Stiffness of left knee, not elsewhere classified: Secondary | ICD-10-CM | POA: Diagnosis not present

## 2021-12-29 DIAGNOSIS — M6281 Muscle weakness (generalized): Secondary | ICD-10-CM | POA: Diagnosis not present

## 2021-12-29 NOTE — Telephone Encounter (Signed)
tyvmh

## 2021-12-29 NOTE — Therapy (Signed)
OUTPATIENT PHYSICAL THERAPY LOWER EXTREMITY EVALUATION   Patient Name: Daniel Caldwell MRN: [id number] DOB:May 26, 1966, 56 y.o., male Today's Date: 12/29/2021   PT End of Session - 12/29/21 1419     Visit Number 1    Number of Visits 24    Date for PT Re-Evaluation 02/23/22    PT Start Time 58    PT Stop Time 1335    PT Time Calculation (min) 35 min    Activity Tolerance Patient tolerated treatment well    Behavior During Therapy [facility] for tasks assessed/performed             Past Medical History:  Diagnosis Date   Left inguinal hernia 01/22/2018   Mixed hyperlipidemia    Pre-diabetes    Recurrent right inguinal hernia 01/22/2018   Sleep apnea    doesn't use CPAP anymore   Past Surgical History:  Procedure Laterality Date   INGUINAL HERNIA REPAIR Bilateral 01/22/2018   Procedure: OPEN BILATERAL INGUINAL HERNIA REPAIR ERAS PATHWAY;  Surgeon: Fanny Skates, MD;  Location: [place];  Service: General;  Laterality: Bilateral;   INSERTION OF MESH Bilateral 01/22/2018   Procedure: INSERTION OF MESH;  Surgeon: Fanny Skates, MD;  Location: [place];  Service: General;  Laterality: Bilateral;   KNEE SURGERY     TOTAL KNEE ARTHROPLASTY Left 12/07/2021   Procedure: LEFT TOTAL KNEE ARTHROPLASTY;  Surgeon: Meredith Pel, MD;  Location: [place];  Service: Orthopedics;  Laterality: Left;   Patient Active Problem List   Diagnosis Date Noted   S/P total knee arthroplasty, left 12/07/2021   Recurrent right inguinal hernia 01/22/2018   Left inguinal hernia 01/22/2018    PCP: Eber Hong, MD  REFERRING PROVIDER: Meredith Pel, MD   REFERRING DIAG: [phone] (ICD-10-CM) - S/P total knee arthroplasty, left   THERAPY DIAG:  Acute pain of left knee - Plan: PT plan of care cert/re-cert  Stiffness of left knee, not elsewhere classified - Plan: PT plan of care cert/re-cert  Muscle weakness (generalized) - Plan: PT plan of care  cert/re-cert  Other abnormalities of gait and mobility - Plan: PT plan of care cert/re-cert  Localized edema - Plan: PT plan of care cert/re-cert  Rationale for Evaluation and Treatment Rehabilitation  ONSET DATE: 12/07/21  SUBJECTIVE:   SUBJECTIVE STATEMENT: Pt is a 56 y/o male who presents to [facility] s/p Lt TKA on 12/07/21.  He did have 2 weeks of HHPT, which ended last week.    PERTINENT HISTORY: sleep apnea and pre-diabetes   PAIN:  Are you having pain? Yes: NPRS scale: 2-3 currently, up to 8, at best 0/10 Pain location: Lt knee Pain description: dull ache, sharp, shooting Aggravating factors: movement Relieving factors: stretching, rest, medication  PRECAUTIONS: None  WEIGHT BEARING RESTRICTIONS No  FALLS:  Has patient fallen in last 6 months? No  LIVING ENVIRONMENT: Lives with: lives with their family (wife, daughters and grandson) Lives in: House/apartment Stairs: Yes: External: 3-4 steps; on left going up Has following equipment at home: Environmental consultant - 2 wheeled and Crutches  OCCUPATION: Full-Time with [employer] (lifting, walking - 12 hour shifts)  PLOF: Independent and Leisure: spend time with family  PATIENT GOALS improve mobility, be able to run/jump againg   OBJECTIVE:   PATIENT SURVEYS:  12/29/21 FOTO 40 (predicted 77)  COGNITION: Overall cognitive status: Within functional limits for tasks assessed     SENSATION: WFL  EDEMA:  12/29/21: Circumferential: Knee joint line: Rt: 35.5 cm; Lt 39 cm  PALPATION: 12/29/21: significant tenderness along lateral joint line; Lt knee  LOWER EXTREMITY ROM:  Active ROM Right eval Left eval  Knee flexion  51  Knee extension  -27 (seated LAQ)   (Blank rows = not tested)   LOWER EXTREMITY ROM:  Passive ROM Right eval Left eval  Knee flexion  60  Knee extension  -5   (Blank rows = not tested)   LOWER EXTREMITY MMT:  MMT Right eval Left eval  Knee flexion  3-/5  Knee extension  3-/5   (Blank rows = not  tested)   GAIT: 12/29/21: Distance walked: 100' Assistive device utilized: Single point cane and Crutches Level of assistance: Modified independence and SBA; min cues for amb with SPC Comments: decr hip/knee flexion, decr heel strike on Lt    TODAY'S TREATMENT: 12/29/21 See HEP - performed trial reps with mod cues for technique; stressed importance of constant work on flexion at this time   PATIENT EDUCATION:  Education details: HEP Person educated: Patient Education method: Consulting civil engineer, Media planner, and Handouts Education comprehension: verbalized understanding, returned demonstration, and needs further education   HOME EXERCISE PROGRAM: Access Code: [email or web address] URL: [email or web address] Date: 12/29/2021 Prepared by: Faustino Congress  Exercises - Supine Quad Set  - 10-12 x daily - 7 x weekly - 1 sets - 5-10 reps - 5 sec hold - Supine Heel Slide with Strap  - 10-12 x daily - 7 x weekly - 1 sets - 5-10 reps - 5 sec hold - Seated Knee Extension AROM  - 5-10 x daily - 7 x weekly - 1 sets - 5-10 reps - 5 sec hold - Seated Knee Flexion AAROM  - 5-10 x daily - 7 x weekly - 1 sets - 5-10 reps - 15-30 sec hold - Mini Squat with Counter Support  - 2-3 x daily - 7 x weekly - 1 sets - 10 reps  ASSESSMENT:  CLINICAL IMPRESSION: Patient is a 56 y.o. male who was seen today for physical therapy evaluation and treatment for Lt TKA on 12/07/21. He demonstrates significant ROM limitations (especially flexion), decreased strength, gait abnormalities and increased swelling affecting balance and functional mobility.  He will benefit from PT to address deficits listed.    OBJECTIVE IMPAIRMENTS Abnormal gait, decreased activity tolerance, decreased balance, decreased knowledge of use of DME, decreased mobility, difficulty walking, decreased ROM, decreased strength, hypomobility, increased edema, impaired flexibility, and pain.   ACTIVITY LIMITATIONS carrying, lifting, bending,  sitting, standing, squatting, sleeping, stairs, transfers, and locomotion level  PARTICIPATION LIMITATIONS: cleaning, driving, community activity, occupation, and yard work  PERSONAL FACTORS 1-2 comorbidities: sleep apnea and pre-diabetes   are also affecting patient's functional outcome.   REHAB POTENTIAL: Good  CLINICAL DECISION MAKING: Evolving/moderate complexity  EVALUATION COMPLEXITY: Moderate   GOALS: Goals reviewed with patient? Yes  SHORT TERM GOALS: Target date: 01/26/2022  Independent with initial HEP Goal status: INITIAL  2.  Lt knee AROM 0-5-90 deg for improved function and mobility Goal status: INITIAL  3.  Amb mod I with SPC for improved mobility Goal status: INITIAL    LONG TERM GOALS: Target date: 02/23/2022  Independent with final HEP Goal status: INITIAL  2.  FOTO score improved to 69 Goal status: INITIAL  3.  Lt knee AROM improved to 0-105 deg for improved mobility and function Goal status: INIITAL  4.  Report pain < 3/10 with ambulation and activity for improved function Goal status: INITIAL  5.  Perform work simulated tasks independently in order to return  to work Goal status: INITIAL  6.  Amb independently without significant deviations for improved function Goal status: INITIAL    PLAN: PT FREQUENCY:  2-3x/wk  PT DURATION: 8 weeks  PLANNED INTERVENTIONS: Therapeutic exercises, Therapeutic activity, Neuromuscular re-education, Balance training, Gait training, Patient/Family education, Joint mobilization, Stair training, DME instructions, Aquatic Therapy, Dry Needling, Electrical stimulation, Cryotherapy, Moist heat, Taping, Vasopneumatic device, Ionotophoresis '4mg'$ /ml Dexamethasone, Manual therapy, and Re-evaluation  PLAN FOR NEXT SESSION: needs aggressive ROM (especially flexion), work on quad activation and strengthening, gait training with SPC     Laureen Abrahams, PT, DPT 12/29/21 2:28 PM

## 2021-12-29 NOTE — Discharge Summary (Signed)
Physician Discharge Summary      Patient ID: Daniel Caldwell MRN: [id number] DOB/AGE: 1966-04-02 56 y.o.  Admit date: 12/07/2021 Discharge date: 12/08/2021  Admission Diagnoses:  Principal Problem:   S/P total knee arthroplasty, left   Discharge Diagnoses:  Same  Surgeries: Procedure(s): LEFT TOTAL KNEE ARTHROPLASTY on 12/07/2021   Consultants:   Discharged Condition: Stable  Hospital Course: Daniel Caldwell is an 56 y.o. male who was admitted 12/07/2021 with a chief complaint of left knee pain, and found to have a diagnosis of left knee osteoarthritis.  They were brought to the operating room on 12/07/2021 and underwent the above named procedures.  Pt awoke from anesthesia without complication and was transferred to the floor. On POD1, patient's pain was controlled and he was able to mobilize well with therapy.  He was discharged home on POD 1..  Pt will f/u with Dr. Marlou Sa in clinic in ~2 weeks.   Antibiotics given:  Anti-infectives (From admission, onward)    Start     Dose/Rate Route Frequency Ordered Stop   12/08/21 0830  ceFAZolin (ANCEF) IVPB 1 g/50 mL premix  Status:  Discontinued        1 g 100 mL/hr over 30 Minutes Intravenous Every 8 hours 12/08/21 0737 12/08/21 1811   12/08/21 0000  cephALEXin (KEFLEX) 500 MG capsule        1,000 mg Oral  Once 12/08/21 1221 12/08/21 2359   12/07/21 0829  vancomycin (VANCOCIN) powder  Status:  Discontinued          As needed 12/07/21 0829 12/07/21 1016   12/07/21 0600  vancomycin (VANCOCIN) IVPB 1000 mg/200 mL premix        1,000 mg 200 mL/hr over 60 Minutes Intravenous On call to O.R. 12/07/21 6712 12/07/21 0754     .  Recent vital signs:  Vitals:   12/08/21 0615 12/08/21 0754  BP: 101/60 (!) 104/56  Pulse: (!) 53 67  Resp:  20  Temp: 98.4 F (36.9 C) 98.8 F (37.1 C)  SpO2: 96% 93%    Recent laboratory studies:  Results for orders placed or performed during the hospital encounter of 11/24/21  Urine Culture   Specimen:  Urine, Clean Catch  Result Value Ref Range   Specimen Description URINE, CLEAN CATCH    Special Requests NONE    Culture      NO GROWTH Performed at [facility], [place] [address] [place], [place] [address]    Report Status 11/25/2021 FINAL   Surgical pcr screen   Specimen: Nasal Mucosa; Nasal Swab  Result Value Ref Range   MRSA, PCR NEGATIVE NEGATIVE   Staphylococcus aureus NEGATIVE NEGATIVE  CBC  Result Value Ref Range   WBC 4.1 4.0 - 10.5 K/uL   RBC 5.03 4.22 - 5.81 MIL/uL   Hemoglobin 14.2 13.0 - 17.0 g/dL   HCT 43.6 39.0 - 52.0 %   MCV 86.7 80.0 - 100.0 fL   MCH 28.2 26.0 - 34.0 pg   MCHC 32.6 30.0 - 36.0 g/dL   RDW 13.2 11.5 - 15.5 %   Platelets 260 150 - 400 K/uL   nRBC 0.0 0.0 - 0.2 %  Basic metabolic panel  Result Value Ref Range   Sodium 140 135 - 145 mmol/L   Potassium 3.9 3.5 - 5.1 mmol/L   Chloride 105 98 - 111 mmol/L   CO2 27 22 - 32 mmol/L   Glucose, Bld 94 70 - 99 mg/dL   BUN 12  6 - 20 mg/dL   Creatinine, Ser 1.02 0.61 - 1.24 mg/dL   Calcium 9.4 8.9 - 10.3 mg/dL   GFR, Estimated >60 >60 mL/min   Anion gap 8 5 - 15  Urinalysis, Routine w reflex microscopic Urine, Clean Catch  Result Value Ref Range   Color, Urine YELLOW YELLOW   APPearance CLEAR CLEAR   Specific Gravity, Urine 1.018 1.005 - 1.030   pH 5.0 5.0 - 8.0   Glucose, UA NEGATIVE NEGATIVE mg/dL   Hgb urine dipstick MODERATE (A) NEGATIVE   Bilirubin Urine NEGATIVE NEGATIVE   Ketones, ur 5 (A) NEGATIVE mg/dL   Protein, ur 30 (A) NEGATIVE mg/dL   Nitrite NEGATIVE NEGATIVE   Leukocytes,Ua NEGATIVE NEGATIVE   RBC / HPF 0-5 0 - 5 RBC/hpf   WBC, UA 0-5 0 - 5 WBC/hpf   Bacteria, UA NONE SEEN NONE SEEN   Mucus PRESENT     Discharge Medications:   Allergies as of 12/08/2021       Reactions   Penicillins Hives, Other (See Comments)   Unknown Has patient had a PCN reaction causing immediate rash, facial/tongue/throat swelling, SOB or lightheadedness with hypotension: Unknown Has patient  had a PCN reaction causing severe rash involving mucus membranes or skin necrosis: Unknown Has patient had a PCN reaction that required hospitalization: Unknown Has patient had a PCN reaction occurring within the last 10 years: No If all of the above answers are "NO", then may proceed with Cephalosporin use. TOLERATED ANCEF ON 12/07/2021        Medication List     STOP taking these medications    oxycodone 5 MG capsule Commonly known as: OXY-IR       TAKE these medications    aspirin 81 MG chewable tablet Chew 1 tablet (81 mg total) by mouth 2 (two) times daily. To prevent blood clots   celecoxib 100 MG capsule Commonly known as: CELEBREX Take 1 capsule (100 mg total) by mouth 2 (two) times daily.   clindamycin 1 % lotion Commonly known as: CLEOCIN T Apply 1 application topically daily.   clobetasol cream 0.05 % Commonly known as: TEMOVATE Apply 1 application topically daily.   fluticasone 50 MCG/ACT nasal spray Commonly known as: FLONASE Place 1 spray into both nostrils daily as needed for allergies or rhinitis.   methocarbamol 500 MG tablet Commonly known as: ROBAXIN Take 1 tablet (500 mg total) by mouth every 8 (eight) hours as needed for muscle spasms.   QC MENS DAILY MULTIVITAMIN PO Take 1 tablet by mouth daily.       ASK your doctor about these medications    cephALEXin 500 MG capsule Commonly known as: KEFLEX Take 2 capsules (1,000 mg total) by mouth once for 1 dose. Take dose around 5PM on 12/08/2021 Ask about: Should I take this medication?        Diagnostic Studies: No results found.  Disposition: Discharge disposition: 01-Home or Self Care       Discharge Instructions     Call MD / Call 911   Complete by: As directed    If you experience chest pain or shortness of breath, CALL 911 and be transported to the hospital emergency room.  If you develope a fever above 101 F, pus (white drainage) or increased drainage or redness at the wound,  or calf pain, call your surgeon's office.   Constipation Prevention   Complete by: As directed    Drink plenty of fluids.  Prune juice may be  helpful.  You may use a stool softener, such as Colace (over the counter) 100 mg twice a Daniel.  Use MiraLax (over the counter) for constipation as needed.   Diet - low sodium heart healthy   Complete by: As directed    Discharge instructions   Complete by: As directed    You may shower, dressing is waterproof.  Do not remove the dressing, we will remove it at your first post-op appointment.  Do not take a bath or soak the knee in a tub or pool.  You may weightbear as you can tolerate on the operative leg with a walker.  Continue using the CPM machine 3 times per Daniel for one hour each time, increasing the degrees of range of motion daily.  Use the blue cradle boot under your heel to work on getting your leg straight.  Do NOT put a pillow under your knee.  You will follow-up with Dr. Marlou Sa in the clinic in 2 weeks at your given appointment date.    INSTRUCTIONS AFTER JOINT REPLACEMENT   Remove items at home which could result in a fall. This includes throw rugs or furniture in walking pathways ICE to the affected joint every three hours while awake for 30 minutes at a time, for at least the first 3-5 days, and then as needed for pain and swelling.  Continue to use ice for pain and swelling. You may notice swelling that will progress down to the foot and ankle.  This is normal after surgery.  Elevate your leg when you are not up walking on it.   Continue to use the breathing machine you got in the hospital (incentive spirometer) which will help keep your temperature down.  It is common for your temperature to cycle up and down following surgery, especially at night when you are not up moving around and exerting yourself.  The breathing machine keeps your lungs expanded and your temperature down.   DIET:  As you were doing prior to hospitalization, we recommend a  well-balanced diet.  DRESSING / WOUND CARE / SHOWERING  Keep the surgical dressing until follow up.  The dressing is water proof, so you can shower without any extra covering.  IF THE DRESSING FALLS OFF or the wound gets wet inside, change the dressing with sterile gauze.  Please use good hand washing techniques before changing the dressing.  Do not use any lotions or creams on the incision until instructed by your surgeon.    ACTIVITY  Increase activity slowly as tolerated, but follow the weight bearing instructions below.   No driving for 6 weeks or until further direction given by your physician.  You cannot drive while taking narcotics.  No lifting or carrying greater than 10 lbs. until further directed by your surgeon. Avoid periods of inactivity such as sitting longer than an hour when not asleep. This helps prevent blood clots.  You may return to work once you are authorized by your doctor.     WEIGHT BEARING   Weight bearing as tolerated with assist device (walker, cane, etc) as directed, use it as long as suggested by your surgeon or therapist, typically at least 4-6 weeks.   EXERCISES  Results after joint replacement surgery are often greatly improved when you follow the exercise, range of motion and muscle strengthening exercises prescribed by your doctor. Safety measures are also important to protect the joint from further injury. Any time any of these exercises cause you to have increased pain  or swelling, decrease what you are doing until you are comfortable again and then slowly increase them. If you have problems or questions, call your caregiver or physical therapist for advice.   Rehabilitation is important following a joint replacement. After just a few days of immobilization, the muscles of the leg can become weakened and shrink (atrophy).  These exercises are designed to build up the tone and strength of the thigh and leg muscles and to improve motion. Often times heat  used for twenty to thirty minutes before working out will loosen up your tissues and help with improving the range of motion but do not use heat for the first two weeks following surgery (sometimes heat can increase post-operative swelling).   These exercises can be done on a training (exercise) mat, on the floor, on a table or on a bed. Use whatever works the best and is most comfortable for you.    Use music or television while you are exercising so that the exercises are a pleasant break in your Daniel. This will make your life better with the exercises acting as a break in your routine that you can look forward to.   Perform all exercises about fifteen times, three times per Daniel or as directed.  You should exercise both the operative leg and the other leg as well.  Exercises include:   Quad Sets - Tighten up the muscle on the front of the thigh (Quad) and hold for 5-10 seconds.   Straight Leg Raises - With your knee straight (if you were given a brace, keep it on), lift the leg to 60 degrees, hold for 3 seconds, and slowly lower the leg.  Perform this exercise against resistance later as your leg gets stronger.  Leg Slides: Lying on your back, slowly slide your foot toward your buttocks, bending your knee up off the floor (only go as far as is comfortable). Then slowly slide your foot back down until your leg is flat on the floor again.  Angel Wings: Lying on your back spread your legs to the side as far apart as you can without causing discomfort.  Hamstring Strength:  Lying on your back, push your heel against the floor with your leg straight by tightening up the muscles of your buttocks.  Repeat, but this time bend your knee to a comfortable angle, and push your heel against the floor.  You may put a pillow under the heel to make it more comfortable if necessary.   A rehabilitation program following joint replacement surgery can speed recovery and prevent re-injury in the future due to weakened  muscles. Contact your doctor or a physical therapist for more information on knee rehabilitation.    CONSTIPATION  Constipation is defined medically as fewer than three stools per week and severe constipation as less than one stool per week.  Even if you have a regular bowel pattern at home, your normal regimen is likely to be disrupted due to multiple reasons following surgery.  Combination of anesthesia, postoperative narcotics, change in appetite and fluid intake all can affect your bowels.   YOU MUST use at least one of the following options; they are listed in order of increasing strength to get the job done.  They are all available over the counter, and you may need to use some, POSSIBLY even all of these options:    Drink plenty of fluids (prune juice may be helpful) and high fiber foods Colace 100 mg by mouth twice a  Daniel  Senokot for constipation as directed and as needed Dulcolax (bisacodyl), take with full glass of water  Miralax (polyethylene glycol) once or twice a Daniel as needed.  If you have tried all these things and are unable to have a bowel movement in the first 3-4 days after surgery call either your surgeon or your primary doctor.    If you experience loose stools or diarrhea, hold the medications until you stool forms back up.  If your symptoms do not get better within 1 week or if they get worse, check with your doctor.  If you experience "the worst abdominal pain ever" or develop nausea or vomiting, please contact the office immediately for further recommendations for treatment.   ITCHING:  If you experience itching with your medications, try taking only a single pain pill, or even half a pain pill at a time.  You can also use Benadryl over the counter for itching or also to help with sleep.   TED HOSE STOCKINGS:  Use stockings on both legs until for at least 2 weeks or as directed by physician office. They may be removed at night for sleeping.  MEDICATIONS:  See your  medication summary on the "After Visit Summary" that nursing will review with you.  You may have some home medications which will be placed on hold until you complete the course of blood thinner medication.  It is important for you to complete the blood thinner medication as prescribed.  PRECAUTIONS:  If you experience chest pain or shortness of breath - call 911 immediately for transfer to the hospital emergency department.   If you develop a fever greater that 101 F, purulent drainage from wound, increased redness or drainage from wound, foul odor from the wound/dressing, or calf pain - CONTACT YOUR SURGEON.                                                   FOLLOW-UP APPOINTMENTS:  If you do not already have a post-op appointment, please call the office for an appointment to be seen by your surgeon.  Guidelines for how soon to be seen are listed in your "After Visit Summary", but are typically between 1-4 weeks after surgery.  OTHER INSTRUCTIONS:   Knee Replacement:  Do not place pillow under knee, focus on keeping the knee straight while resting. CPM instructions: 0-90 degrees, 2 hours in the morning, 2 hours in the afternoon, and 2 hours in the evening. Place foam block, curve side up under heel at all times except when in CPM or when walking.  DO NOT modify, tear, cut, or change the foam block in any way.  POST-OPERATIVE OPIOID TAPER INSTRUCTIONS: It is important to wean off of your opioid medication as soon as possible. If you do not need pain medication after your surgery it is ok to stop Daniel one. Opioids include: Codeine, Hydrocodone(Norco, Vicodin), Oxycodone(Percocet, oxycontin) and hydromorphone amongst others.  Long term and even short term use of opiods can cause: Increased pain response Dependence Constipation Depression Respiratory depression And more.  Withdrawal symptoms can include Flu like symptoms Nausea, vomiting And more Techniques to manage these symptoms Hydrate  well Eat regular healthy meals Stay active Use relaxation techniques(deep breathing, meditating, yoga) Do Not substitute Alcohol to help with tapering If you have been on opioids for less than two  weeks and do not have pain than it is ok to stop all together.  Plan to wean off of opioids This plan should start within one week post op of your joint replacement. Maintain the same interval or time between taking each dose and first decrease the dose.  Cut the total daily intake of opioids by one tablet each Daniel Next start to increase the time between doses. The last dose that should be eliminated is the evening dose.   MAKE SURE YOU:  Understand these instructions.  Get help right away if you are not doing well or get worse.    Thank you for letting us be a part of your medical care team.  It is a privilege we respect greatly.  We hope these instructions will help you stay on track for a fast and full recovery!    Dental Antibiotics:  In most cases prophylactic antibiotics for Dental procdeures after total joint surgery are not necessary.  Exceptions are as follows:  1. History of prior total joint infection  2. Severely immunocompromised (Organ Transplant, cancer chemotherapy, Rheumatoid biologic meds such as [facility])  3. Poorly controlled diabetes (A1C &gt; 8.0, blood glucose over 200)  If you have one of these conditions, contact your surgeon for an antibiotic prescription, prior to your dental procedure.   Increase activity slowly as tolerated   Complete by: As directed    Post-operative opioid taper instructions:   Complete by: As directed    POST-OPERATIVE OPIOID TAPER INSTRUCTIONS: It is important to wean off of your opioid medication as soon as possible. If you do not need pain medication after your surgery it is ok to stop Daniel one. Opioids include: Codeine, Hydrocodone(Norco, Vicodin), Oxycodone(Percocet, oxycontin) and hydromorphone amongst others.  Long term and  even short term use of opiods can cause: Increased pain response Dependence Constipation Depression Respiratory depression And more.  Withdrawal symptoms can include Flu like symptoms Nausea, vomiting And more Techniques to manage these symptoms Hydrate well Eat regular healthy meals Stay active Use relaxation techniques(deep breathing, meditating, yoga) Do Not substitute Alcohol to help with tapering If you have been on opioids for less than two weeks and do not have pain than it is ok to stop all together.  Plan to wean off of opioids This plan should start within one week post op of your joint replacement. Maintain the same interval or time between taking each dose and first decrease the dose.  Cut the total daily intake of opioids by one tablet each Daniel Next start to increase the time between doses. The last dose that should be eliminated is the evening dose.           Follow-up Information     [facility] [place] Follow up.   Specialty: Home Health Services Why: home health services will be provided by [facility] information: [address] [place] [address] [phone]         Eber Hong, MD Follow up.   Specialty: Internal Medicine Contact information: [address] [place] [address] [phone]                  Signed: Donella Stade 12/29/2021, 8:08 PM

## 2021-12-30 ENCOUNTER — Ambulatory Visit (INDEPENDENT_AMBULATORY_CARE_PROVIDER_SITE_OTHER): Payer: BC Managed Care – PPO | Admitting: Physical Therapy

## 2021-12-30 ENCOUNTER — Encounter: Payer: BC Managed Care – PPO | Admitting: Rehabilitative and Restorative Service Providers"

## 2021-12-30 ENCOUNTER — Encounter: Payer: Self-pay | Admitting: Physical Therapy

## 2021-12-30 DIAGNOSIS — M25662 Stiffness of left knee, not elsewhere classified: Secondary | ICD-10-CM | POA: Diagnosis not present

## 2021-12-30 DIAGNOSIS — M25562 Pain in left knee: Secondary | ICD-10-CM

## 2021-12-30 DIAGNOSIS — R600 Localized edema: Secondary | ICD-10-CM

## 2021-12-30 DIAGNOSIS — M6281 Muscle weakness (generalized): Secondary | ICD-10-CM | POA: Diagnosis not present

## 2021-12-30 DIAGNOSIS — R2689 Other abnormalities of gait and mobility: Secondary | ICD-10-CM | POA: Diagnosis not present

## 2021-12-30 NOTE — Therapy (Signed)
OUTPATIENT PHYSICAL THERAPY TREATMENT NOTE   Patient Name: PHILEMON RIEDESEL MRN: [id number] DOB:15-Dec-1965, 56 y.o., male Today's Date: 12/30/2021   END OF SESSION:   PT End of Session - 12/30/21 1524     Visit Number 2    Number of Visits 24    Date for PT Re-Evaluation 02/23/22    PT Start Time 0867    PT Stop Time 6195    PT Time Calculation (min) 39 min    Activity Tolerance Patient tolerated treatment well    Behavior During Therapy [facility] for tasks assessed/performed             Past Medical History:  Diagnosis Date   Left inguinal hernia 01/22/2018   Mixed hyperlipidemia    Pre-diabetes    Recurrent right inguinal hernia 01/22/2018   Sleep apnea    doesn't use CPAP anymore   Past Surgical History:  Procedure Laterality Date   INGUINAL HERNIA REPAIR Bilateral 01/22/2018   Procedure: OPEN BILATERAL INGUINAL HERNIA REPAIR ERAS PATHWAY;  Surgeon: Fanny Skates, MD;  Location: [place];  Service: General;  Laterality: Bilateral;   INSERTION OF MESH Bilateral 01/22/2018   Procedure: INSERTION OF MESH;  Surgeon: Fanny Skates, MD;  Location: [place];  Service: General;  Laterality: Bilateral;   KNEE SURGERY     TOTAL KNEE ARTHROPLASTY Left 12/07/2021   Procedure: LEFT TOTAL KNEE ARTHROPLASTY;  Surgeon: Meredith Pel, MD;  Location: [place];  Service: Orthopedics;  Laterality: Left;   Patient Active Problem List   Diagnosis Date Noted   S/P total knee arthroplasty, left 12/07/2021   Recurrent right inguinal hernia 01/22/2018   Left inguinal hernia 01/22/2018     THERAPY DIAG:  Acute pain of left knee  Stiffness of left knee, not elsewhere classified  Muscle weakness (generalized)  Other abnormalities of gait and mobility  Localized edema  PCP: Eber Hong, MD   REFERRING PROVIDER: Meredith Pel, MD    REFERRING DIAG: [phone] (ICD-10-CM) - S/P total knee arthroplasty, left    Rationale for Evaluation and  Treatment Rehabilitation   ONSET DATE: 12/07/21   SUBJECTIVE:    SUBJECTIVE STATEMENT: Walking with cane today.  Working on ROM at home    PERTINENT HISTORY: sleep apnea and pre-diabetes    PAIN:  Are you having pain? Yes: NPRS scale: 7 with working flexion/10 Pain location: Lt knee Pain description: dull ache, sharp, shooting Aggravating factors: movement Relieving factors: stretching, rest, medication   PRECAUTIONS: None   WEIGHT BEARING RESTRICTIONS No     OBJECTIVE:    PATIENT SURVEYS:  12/29/21 FOTO 40 (predicted 69)   EDEMA:  12/29/21: Circumferential: Knee joint line: Rt: 35.5 cm; Lt 39 cm   PALPATION: 12/29/21: significant tenderness along lateral joint line; Lt knee   LOWER EXTREMITY ROM:   Active ROM Right eval Left eval Left 12/30/21  Knee flexion   51 (supine) 55 (sitting)  Knee extension   -27 (seated LAQ)    (Blank rows = not tested)     LOWER EXTREMITY ROM:   Passive ROM Right eval Left eval  Knee flexion   60  Knee extension   -5   (Blank rows = not tested)     LOWER EXTREMITY MMT:   MMT Right eval Left eval  Knee flexion   3-/5  Knee extension   3-/5   (Blank rows = not tested)     GAIT: 12/29/21: Distance walked: 100' Assistive device utilized: Single  point cane and Crutches Level of assistance: Modified independence and SBA; min cues for amb with SPC Comments: decr hip/knee flexion, decr heel strike on Lt       TODAY'S TREATMENT: 12/30/21 Therex: NuStep L5 x 8 min for ROM with flexion focus Seated dangle x 2 min with improved relaxation of quad AA Lt knee flexion 10 x 10 sec hold (RLE providing assist) Rt LAQ with Lt hamstring curl 10 x 5 sec hold  Manual: Seated Lt knee flexion to tolerance, Rt foot on PT's thigh to limit hip hike on Lt, use of percussive device to help decrease quad guarding  (Pt became very lightheaded after exercises; symptoms improved with beverage, lying supine, feet elevated and ankle pumps.  BP  122/66; HR 47)  12/29/21 See HEP - performed trial reps with mod cues for technique; stressed importance of constant work on flexion at this time     PATIENT EDUCATION:  Education details: HEP Person educated: Patient Education method: Consulting civil engineer, Media planner, and Handouts Education comprehension: verbalized understanding, returned demonstration, and needs further education     HOME EXERCISE PROGRAM: Access Code: [email or web address] URL: [email or web address] Date: 12/29/2021 Prepared by: Faustino Congress   Exercises - Supine Quad Set  - 10-12 x daily - 7 x weekly - 1 sets - 5-10 reps - 5 sec hold - Supine Heel Slide with Strap  - 10-12 x daily - 7 x weekly - 1 sets - 5-10 reps - 5 sec hold - Seated Knee Extension AROM  - 5-10 x daily - 7 x weekly - 1 sets - 5-10 reps - 5 sec hold - Seated Knee Flexion AAROM  - 5-10 x daily - 7 x weekly - 1 sets - 5-10 reps - 15-30 sec hold - Mini Squat with Counter Support  - 2-3 x daily - 7 x weekly - 1 sets - 10 reps   ASSESSMENT:   CLINICAL IMPRESSION: Pt with slight improvement in flexion noted today, but has hard time tolerating ROM due to pain.  Will continue to benefit from PT to maximize function.   OBJECTIVE IMPAIRMENTS Abnormal gait, decreased activity tolerance, decreased balance, decreased knowledge of use of DME, decreased mobility, difficulty walking, decreased ROM, decreased strength, hypomobility, increased edema, impaired flexibility, and pain.    ACTIVITY LIMITATIONS carrying, lifting, bending, sitting, standing, squatting, sleeping, stairs, transfers, and locomotion level   PARTICIPATION LIMITATIONS: cleaning, driving, community activity, occupation, and yard work   PERSONAL FACTORS 1-2 comorbidities: sleep apnea and pre-diabetes   are also affecting patient's functional outcome.    REHAB POTENTIAL: Good   CLINICAL DECISION MAKING: Evolving/moderate complexity   EVALUATION COMPLEXITY: Moderate     GOALS: Goals  reviewed with patient? Yes   SHORT TERM GOALS: Target date: 01/26/2022   Independent with initial HEP Goal status: INITIAL   2.  Lt knee AROM 0-5-90 deg for improved function and mobility Goal status: INITIAL   3.  Amb mod I with SPC for improved mobility Goal status: INITIAL       LONG TERM GOALS: Target date: 02/23/2022   Independent with final HEP Goal status: INITIAL   2.  FOTO score improved to 69 Goal status: INITIAL   3.  Lt knee AROM improved to 0-105 deg for improved mobility and function Goal status: INIITAL   4.  Report pain < 3/10 with ambulation and activity for improved function Goal status: INITIAL   5.  Perform work simulated tasks independently in order to return to work Goal status: INITIAL  6.  Amb independently without significant deviations for improved function Goal status: INITIAL       PLAN: PT FREQUENCY:  2-3x/wk   PT DURATION: 8 weeks   PLANNED INTERVENTIONS: Therapeutic exercises, Therapeutic activity, Neuromuscular re-education, Balance training, Gait training, Patient/Family education, Joint mobilization, Stair training, DME instructions, Aquatic Therapy, Dry Needling, Electrical stimulation, Cryotherapy, Moist heat, Taping, Vasopneumatic device, Ionotophoresis '4mg'$ /ml Dexamethasone, Manual therapy, and Re-evaluation   PLAN FOR NEXT SESSION: needs aggressive ROM (especially flexion), work on quad activation and strengthening, gait training with SPC       Laureen Abrahams, PT, DPT 12/30/21 3:56 PM

## 2021-12-31 ENCOUNTER — Ambulatory Visit (HOSPITAL_COMMUNITY): Payer: BC Managed Care – PPO | Attending: Orthopedic Surgery | Admitting: Physical Therapy

## 2021-12-31 DIAGNOSIS — R600 Localized edema: Secondary | ICD-10-CM | POA: Diagnosis present

## 2021-12-31 DIAGNOSIS — M6281 Muscle weakness (generalized): Secondary | ICD-10-CM | POA: Diagnosis present

## 2021-12-31 DIAGNOSIS — R2689 Other abnormalities of gait and mobility: Secondary | ICD-10-CM | POA: Insufficient documentation

## 2021-12-31 DIAGNOSIS — M25562 Pain in left knee: Secondary | ICD-10-CM | POA: Diagnosis present

## 2021-12-31 DIAGNOSIS — M25662 Stiffness of left knee, not elsewhere classified: Secondary | ICD-10-CM | POA: Insufficient documentation

## 2021-12-31 NOTE — Therapy (Signed)
OUTPATIENT PHYSICAL THERAPY TREATMENT NOTE   Patient Name: Daniel Caldwell MRN: [id number] DOB:1965-10-26, 56 y.o., male Today's Date: 12/31/2021   END OF SESSION:   PT End of Session - 12/31/21 1014     Visit Number 3    Number of Visits 24    Date for PT Re-Evaluation 02/23/22    Progress Note Due on Visit 10    PT Start Time 1005    PT Stop Time 1045    PT Time Calculation (min) 40 min    Activity Tolerance Patient tolerated treatment well    Behavior During Therapy [facility] for tasks assessed/performed             Past Medical History:  Diagnosis Date   Left inguinal hernia 01/22/2018   Mixed hyperlipidemia    Pre-diabetes    Recurrent right inguinal hernia 01/22/2018   Sleep apnea    doesn't use CPAP anymore   Past Surgical History:  Procedure Laterality Date   INGUINAL HERNIA REPAIR Bilateral 01/22/2018   Procedure: OPEN BILATERAL INGUINAL HERNIA REPAIR ERAS PATHWAY;  Surgeon: Fanny Skates, MD;  Location: [place];  Service: General;  Laterality: Bilateral;   INSERTION OF MESH Bilateral 01/22/2018   Procedure: INSERTION OF MESH;  Surgeon: Fanny Skates, MD;  Location: [place];  Service: General;  Laterality: Bilateral;   KNEE SURGERY     TOTAL KNEE ARTHROPLASTY Left 12/07/2021   Procedure: LEFT TOTAL KNEE ARTHROPLASTY;  Surgeon: Meredith Pel, MD;  Location: [place];  Service: Orthopedics;  Laterality: Left;   Patient Active Problem List   Diagnosis Date Noted   S/P total knee arthroplasty, left 12/07/2021   Recurrent right inguinal hernia 01/22/2018   Left inguinal hernia 01/22/2018     THERAPY DIAG:  Acute pain of left knee  Stiffness of left knee, not elsewhere classified  Muscle weakness (generalized)  Other abnormalities of gait and mobility  Localized edema  PCP: Eber Hong, MD   REFERRING PROVIDER: Meredith Pel, MD    REFERRING DIAG: [phone] (ICD-10-CM) - S/P total knee arthroplasty, left     Rationale for Evaluation and Treatment Rehabilitation   ONSET DATE: 12/07/21   SUBJECTIVE:    SUBJECTIVE STATEMENT: Pt states he really doesn't have pain; it is more stiffness.    PERTINENT HISTORY: sleep apnea and pre-diabetes    PAIN:  Are you having pain? Yes: NPRS scale: 8 more stiffness  Pain location: Lt knee Pain description: dull ache, sharp, shooting Aggravating factors: movement Relieving factors: stretching, rest, medication   PRECAUTIONS: None   WEIGHT BEARING RESTRICTIONS No     OBJECTIVE:    PATIENT SURVEYS:  12/29/21 FOTO 40 (predicted 69)   EDEMA:  12/29/21: Circumferential: Knee joint line: Rt: 35.5 cm; Lt 39 cm   PALPATION: 12/29/21: significant tenderness along lateral joint line; Lt knee   LOWER EXTREMITY ROM:   Active ROM Right eval Left eval Left 12/30/21 Left   Knee flexion   51 (supine) 55 (sitting) 70  Knee extension   -27 (seated LAQ)  -3   (Blank rows = not tested)     LOWER EXTREMITY ROM:   Passive ROM Right eval Left eval   Knee flexion   60   Knee extension   -5    (Blank rows = not tested)     LOWER EXTREMITY MMT:   MMT Right eval Left eval  Knee flexion   3-/5  Knee extension   3-/5   (Blank  rows = not tested)     GAIT: 12/29/21: Distance walked: 100' Assistive device utilized: Single point cane and Crutches Level of assistance: Modified independence and SBA; min cues for amb with SPC Comments: decr hip/knee flexion, decr heel strike on Lt       TODAY'S TREATMENT: 12/31/21:  Bike for Rom X 5 Standing: Lt knee flexion x 10 Heel raises x 10 Rocker board x 1' Knee driver with 4" x 10" 5 Passive hamstring stretch x 10 Gait training concentrating on heel toe gt Sitting: LAQ x 10 Quad set x 10 Heelslide x 10 Active hamstring stretch with active flexion x 3  12/30/21 Therex: NuStep L5 x 8 min for ROM with flexion focus Seated dangle x 2 min with improved relaxation of quad AA Lt knee flexion 10 x 10 sec  hold (RLE providing assist) Rt LAQ with Lt hamstring curl 10 x 5 sec hold  Manual: Seated Lt knee flexion to tolerance, Rt foot on PT's thigh to limit hip hike on Lt, use of percussive device to help decrease quad guarding  (Pt became very lightheaded after exercises; symptoms improved with beverage, lying supine, feet elevated and ankle pumps.  BP 122/66; HR 47)  12/29/21 See HEP - performed trial reps with mod cues for technique; stressed importance of constant work on flexion at this time     PATIENT EDUCATION:  Education details: HEP Person educated: Patient Education method: Consulting civil engineer, Media planner, and Handouts Education comprehension: verbalized understanding, returned demonstration, and needs further education     HOME EXERCISE PROGRAM: Access Code: [email or web address] URL: [email or web address] Date: 12/29/2021 Prepared by: Faustino Congress   Exercises - Supine Quad Set  - 10-12 x daily - 7 x weekly - 1 sets - 5-10 reps - 5 sec hold - Supine Heel Slide with Strap  - 10-12 x daily - 7 x weekly - 1 sets - 5-10 reps - 5 sec hold - Seated Knee Extension AROM  - 5-10 x daily - 7 x weekly - 1 sets - 5-10 reps - 5 sec hold - Seated Knee Flexion AAROM  - 5-10 x daily - 7 x weekly - 1 sets - 5-10 reps - 15-30 sec hold - Mini Squat with Counter Support  - 2-3 x daily - 7 x weekly - 1 sets - 10 reps   ASSESSMENT:   CLINICAL IMPRESSION: Added several exercises to promote improved ROM.  PT able to tolerate well.  PT continues to have decreased ROM, decreased strength and abnormal gt.  Pt will continue to benefit from skilled therapy to address these deficits.    OBJECTIVE IMPAIRMENTS Abnormal gait, decreased activity tolerance, decreased balance, decreased knowledge of use of DME, decreased mobility, difficulty walking, decreased ROM, decreased strength, hypomobility, increased edema, impaired flexibility, and pain.    ACTIVITY LIMITATIONS carrying, lifting, bending,  sitting, standing, squatting, sleeping, stairs, transfers, and locomotion level   PARTICIPATION LIMITATIONS: cleaning, driving, community activity, occupation, and yard work   PERSONAL FACTORS 1-2 comorbidities: sleep apnea and pre-diabetes   are also affecting patient's functional outcome.    REHAB POTENTIAL: Good   CLINICAL DECISION MAKING: Evolving/moderate complexity   EVALUATION COMPLEXITY: Moderate     GOALS: Goals reviewed with patient? Yes   SHORT TERM GOALS: Target date: 01/26/2022   Independent with initial HEP Goal status: met   2.  Lt knee AROM 0-5-90 deg for improved function and mobility Goal status: INITIAL   3.  Amb mod I with SPC for improved mobility Goal status:  met       LONG TERM GOALS: Target date: 02/23/2022   Independent with final HEP Goal status: INITIAL   2.  FOTO score improved to 69 Goal status: INITIAL   3.  Lt knee AROM improved to 0-105 deg for improved mobility and function Goal status: INIITAL   4.  Report pain < 3/10 with ambulation and activity for improved function Goal status: INITIAL   5.  Perform work simulated tasks independently in order to return to work Goal status: INITIAL   6.  Amb independently without significant deviations for improved function Goal status: INITIAL       PLAN: PT FREQUENCY:  2-3x/wk   PT DURATION: 8 weeks   PLANNED INTERVENTIONS: Therapeutic exercises, Therapeutic activity, Neuromuscular re-education, Balance training, Gait training, Patient/Family education, Joint mobilization, Stair training, DME instructions, Aquatic Therapy, Dry Needling, Electrical stimulation, Cryotherapy, Moist heat, Taping, Vasopneumatic device, Ionotophoresis 30m/ml Dexamethasone, Manual therapy, and Re-evaluation   PLAN FOR NEXT SESSION: needs aggressive ROM (especially flexion), work on quad activation begin SDesigner, industrial/product gait training [place] [facility] [phone] 12/31/21 10:45  AM

## 2022-01-03 ENCOUNTER — Encounter (HOSPITAL_COMMUNITY): Payer: Self-pay | Admitting: Physical Therapy

## 2022-01-03 ENCOUNTER — Ambulatory Visit (HOSPITAL_COMMUNITY): Payer: BC Managed Care – PPO | Attending: Orthopedic Surgery | Admitting: Physical Therapy

## 2022-01-03 ENCOUNTER — Telehealth: Payer: Self-pay | Admitting: Orthopedic Surgery

## 2022-01-03 DIAGNOSIS — M6281 Muscle weakness (generalized): Secondary | ICD-10-CM | POA: Insufficient documentation

## 2022-01-03 DIAGNOSIS — M25662 Stiffness of left knee, not elsewhere classified: Secondary | ICD-10-CM | POA: Diagnosis present

## 2022-01-03 DIAGNOSIS — M25562 Pain in left knee: Secondary | ICD-10-CM | POA: Insufficient documentation

## 2022-01-03 DIAGNOSIS — R2689 Other abnormalities of gait and mobility: Secondary | ICD-10-CM | POA: Diagnosis present

## 2022-01-03 DIAGNOSIS — R600 Localized edema: Secondary | ICD-10-CM | POA: Insufficient documentation

## 2022-01-03 NOTE — Therapy (Addendum)
OUTPATIENT PHYSICAL THERAPY TREATMENT NOTE DISCHARGE SUMMARY   Patient Name: Daniel Caldwell MRN: [id number] DOB:1965/10/03, 56 y.o., male Today's Date: 01/03/2022   END OF SESSION:   PT End of Session - 01/03/22 0948     Visit Number 4    Number of Visits 24    Date for PT Re-Evaluation 02/23/22    Progress Note Due on Visit 10    PT Start Time 0948    PT Stop Time 1026    PT Time Calculation (min) 38 min    Activity Tolerance Patient tolerated treatment well    Behavior During Therapy [facility] for tasks assessed/performed             Past Medical History:  Diagnosis Date   Left inguinal hernia 01/22/2018   Mixed hyperlipidemia    Pre-diabetes    Recurrent right inguinal hernia 01/22/2018   Sleep apnea    doesn't use CPAP anymore   Past Surgical History:  Procedure Laterality Date   INGUINAL HERNIA REPAIR Bilateral 01/22/2018   Procedure: OPEN BILATERAL INGUINAL HERNIA REPAIR ERAS PATHWAY;  Surgeon: Fanny Skates, MD;  Location: [place];  Service: General;  Laterality: Bilateral;   INSERTION OF MESH Bilateral 01/22/2018   Procedure: INSERTION OF MESH;  Surgeon: Fanny Skates, MD;  Location: [place];  Service: General;  Laterality: Bilateral;   KNEE SURGERY     TOTAL KNEE ARTHROPLASTY Left 12/07/2021   Procedure: LEFT TOTAL KNEE ARTHROPLASTY;  Surgeon: Meredith Pel, MD;  Location: [place];  Service: Orthopedics;  Laterality: Left;   Patient Active Problem List   Diagnosis Date Noted   S/P total knee arthroplasty, left 12/07/2021   Recurrent right inguinal hernia 01/22/2018   Left inguinal hernia 01/22/2018     THERAPY DIAG:  Acute pain of left knee  Stiffness of left knee, not elsewhere classified  Muscle weakness (generalized)  Other abnormalities of gait and mobility  Localized edema  PCP: Eber Hong, MD   REFERRING PROVIDER: Meredith Pel, MD    REFERRING DIAG: [phone] (ICD-10-CM) - S/P total knee  arthroplasty, left    Rationale for Evaluation and Treatment Rehabilitation   ONSET DATE: 12/07/21   SUBJECTIVE:    SUBJECTIVE STATEMENT: Knee is still stiff. HEP going well.   PERTINENT HISTORY: sleep apnea and pre-diabetes    PAIN:  Are you having pain? Yes: NPRS scale: 8 more stiffness  Pain location: Lt knee Pain description: dull ache, sharp, shooting Aggravating factors: movement Relieving factors: stretching, rest, medication   PRECAUTIONS: None   WEIGHT BEARING RESTRICTIONS No     OBJECTIVE:    PATIENT SURVEYS:  12/29/21 FOTO 40 (predicted 69)   EDEMA:  12/29/21: Circumferential: Knee joint line: Rt: 35.5 cm; Lt 39 cm   PALPATION: 12/29/21: significant tenderness along lateral joint line; Lt knee   LOWER EXTREMITY ROM:   Active ROM Right eval Left eval Left 12/30/21 Left  Left 01/03/22  Knee flexion   51 (supine) 55 (sitting) 70 63  Knee extension   -27 (seated LAQ)  -3 -3   (Blank rows = not tested)     LOWER EXTREMITY ROM:   Passive ROM Right eval Left eval   Knee flexion   60   Knee extension   -5    (Blank rows = not tested)     LOWER EXTREMITY MMT:   MMT Right eval Left eval  Knee flexion   3-/5  Knee extension   3-/5   (  Blank rows = not tested)     GAIT: 12/29/21: Distance walked: 100' Assistive device utilized: Single point cane and Crutches Level of assistance: Modified independence and SBA; min cues for amb with SPC Comments: decr hip/knee flexion, decr heel strike on Lt       TODAY'S TREATMENT: 01/03/22 Bike rocking with holds at end range flexion 5 minutes Supine heel slides with belt 10 x 10 second holds LLE Supine hamstring set into red ball 10 x 10 second holds LLE SLR with quad set 2x 10  Contract relax in seated for flexion 15 x 10 second holds Self STM with rolling stick 5 minutes to L quads Standing heel raises x 10    12/31/21:  Bike for Rom X 5 Standing: Lt knee flexion x 10 Heel raises x 10 Rocker board x  1' Knee driver with 4" x 10" 5 Passive hamstring stretch x 10 Gait training concentrating on heel toe gt Sitting: LAQ x 10 Quad set x 10 Heelslide x 10 Active hamstring stretch with active flexion x 3  12/30/21 Therex: NuStep L5 x 8 min for ROM with flexion focus Seated dangle x 2 min with improved relaxation of quad AA Lt knee flexion 10 x 10 sec hold (RLE providing assist) Rt LAQ with Lt hamstring curl 10 x 5 sec hold  Manual: Seated Lt knee flexion to tolerance, Rt foot on PT's thigh to limit hip hike on Lt, use of percussive device to help decrease quad guarding  (Pt became very lightheaded after exercises; symptoms improved with beverage, lying supine, feet elevated and ankle pumps.  BP 122/66; HR 47)  12/29/21 See HEP - performed trial reps with mod cues for technique; stressed importance of constant work on flexion at this time     PATIENT EDUCATION:  Education details: HEP 7/3 HEP, knee positioning and stretching at home, massage quad Person educated: Patient Education method: Explanation, Demonstration, and Handouts Education comprehension: verbalized understanding, returned demonstration, and needs further education     HOME EXERCISE PROGRAM: Access Code: [email or web address] URL: [email or web address] Date: 12/29/2021 Prepared by: Faustino Congress   Exercises - Supine Quad Set  - 10-12 x daily - 7 x weekly - 1 sets - 5-10 reps - 5 sec hold - Supine Heel Slide with Strap  - 10-12 x daily - 7 x weekly - 1 sets - 5-10 reps - 5 sec hold - Seated Knee Extension AROM  - 5-10 x daily - 7 x weekly - 1 sets - 5-10 reps - 5 sec hold - Seated Knee Flexion AAROM  - 5-10 x daily - 7 x weekly - 1 sets - 5-10 reps - 15-30 sec hold - Mini Squat with Counter Support  - 2-3 x daily - 7 x weekly - 1 sets - 10 reps   ASSESSMENT:   CLINICAL IMPRESSION: Began session on bike for dynamic warm up and ROM. Patient very limited with L knee flexion which does not improve with heel  slides. Patient lacking TKE ROM but no quad lag with SLR. ROM improves to 67 following contract relax.  Patient will continue to benefit from physical therapy in order to improve function and reduce impairment.   OBJECTIVE IMPAIRMENTS Abnormal gait, decreased activity tolerance, decreased balance, decreased knowledge of use of DME, decreased mobility, difficulty walking, decreased ROM, decreased strength, hypomobility, increased edema, impaired flexibility, and pain.    ACTIVITY LIMITATIONS carrying, lifting, bending, sitting, standing, squatting, sleeping, stairs, transfers, and locomotion level   PARTICIPATION LIMITATIONS: cleaning, driving,  community activity, occupation, and yard work   PERSONAL FACTORS 1-2 comorbidities: sleep apnea and pre-diabetes   are also affecting patient's functional outcome.    REHAB POTENTIAL: Good   CLINICAL DECISION MAKING: Evolving/moderate complexity   EVALUATION COMPLEXITY: Moderate     GOALS: Goals reviewed with patient? Yes   SHORT TERM GOALS: Target date: 01/26/2022   Independent with initial HEP Goal status: met   2.  Lt knee AROM 0-5-90 deg for improved function and mobility Goal status: INITIAL   3.  Amb mod I with SPC for improved mobility Goal status: met       LONG TERM GOALS: Target date: 02/23/2022   Independent with final HEP Goal status: INITIAL   2.  FOTO score improved to 69 Goal status: INITIAL   3.  Lt knee AROM improved to 0-105 deg for improved mobility and function Goal status: INIITAL   4.  Report pain < 3/10 with ambulation and activity for improved function Goal status: INITIAL   5.  Perform work simulated tasks independently in order to return to work Goal status: INITIAL   6.  Amb independently without significant deviations for improved function Goal status: INITIAL       PLAN: PT FREQUENCY:  2-3x/wk   PT DURATION: 8 weeks   PLANNED INTERVENTIONS: Therapeutic exercises, Therapeutic activity,  Neuromuscular re-education, Balance training, Gait training, Patient/Family education, Joint mobilization, Stair training, DME instructions, Aquatic Therapy, Dry Needling, Electrical stimulation, Cryotherapy, Moist heat, Taping, Vasopneumatic device, Ionotophoresis 27m/ml Dexamethasone, Manual therapy, and Re-evaluation   PLAN FOR NEXT SESSION: needs aggressive ROM (especially flexion), work on quad activation begin SAQ and strengthening, gait training with SPC    9:49 AM, 01/03/22 AMearl LatinPT, DPT [facility] Visits from Start of Care: 4  Current functional level related to goals / functional outcomes: SEE ABOVE   Remaining deficits: See above   Education / Equipment: HEP   Patient agrees to discharge. Patient goals were not met. Patient is being discharged due to not returning since the last visit.  SLaureen Abrahams PT, DPT 03/04/22 10:40 AM  [facility] [address] [address]Phone: [phone]  Fax:  [phone]

## 2022-01-03 NOTE — Telephone Encounter (Signed)
He needs to work on knee range of motion straightening and flexion at least 4 to 6 hours a day and really push it as hard as he can in order to get motion in the knee.  Otherwise he will end up with a fairly stiff knee.  Therapy is helpful sometimes to add the assist of manual manipulation of the knee.  However if he does not want to do that then definitely he needs to assume responsibility for that and get the knee moving as much as possible.  Typically will take for stiff knee like it is around 4 to 6 hours/day

## 2022-01-03 NOTE — Telephone Encounter (Signed)
Pt called and states he is not going to be able to pay 20.00 each time he comes to therapy. He wants to know what to do

## 2022-01-04 ENCOUNTER — Other Ambulatory Visit: Payer: Self-pay | Admitting: Surgical

## 2022-01-04 DIAGNOSIS — M1712 Unilateral primary osteoarthritis, left knee: Secondary | ICD-10-CM

## 2022-01-05 NOTE — Telephone Encounter (Signed)
Tried calling no answer. LMVM for patient to return my call

## 2022-01-05 NOTE — Telephone Encounter (Signed)
Patient has OV tomorrow. 

## 2022-01-06 ENCOUNTER — Ambulatory Visit (INDEPENDENT_AMBULATORY_CARE_PROVIDER_SITE_OTHER): Payer: BC Managed Care – PPO | Admitting: Surgical

## 2022-01-06 ENCOUNTER — Encounter: Payer: Self-pay | Admitting: Orthopedic Surgery

## 2022-01-06 DIAGNOSIS — M24662 Ankylosis, left knee: Secondary | ICD-10-CM

## 2022-01-06 DIAGNOSIS — Z96652 Presence of left artificial knee joint: Secondary | ICD-10-CM

## 2022-01-06 MED ORDER — CELECOXIB 100 MG PO CAPS: 100.0000 mg | ORAL_CAPSULE | Freq: Two times a day (BID) | ORAL | 0 refills | Status: DC

## 2022-01-06 MED ORDER — OXYCODONE HCL 5 MG PO TABS: 5.0000 mg | ORAL_TABLET | Freq: Three times a day (TID) | ORAL | 0 refills | Status: DC | PRN

## 2022-01-06 NOTE — Progress Notes (Signed)
Post-Op Visit Note   Patient: Daniel Caldwell           Date of Birth: 10-18-1965           MRN: [id number] Visit Date: 01/06/2022 PCP: Eber Hong, MD   Assessment & Plan:  Chief Complaint:  Chief Complaint  Patient presents with   Left Knee - Follow-up   Visit Diagnoses:  1. S/P total knee arthroplasty, left   2. Arthrofibrosis of knee joint, left     Plan: Patient is a 56 year old male who presents s/p left total knee arthroplasty on 12/07/2021.  Ambulating with a cane.  Going to physical therapy in [place].  Feels like he is still having a lot of stiffness with flexion.  Continuing to take Celebrex and oxycodone.  No fevers, chills, night sweats, drainage from the incision, chest pain, shortness of breath, calf pain.  On exam, patient has 8 degrees extension and 50 degrees of knee flexion.  Incision is well-healed.  Quad strength improving and quad is firing more than it was 2 weeks ago.  He is able to perform straight leg raise.  No calf tenderness.  Negative Homans' sign.  Decreased side to side patellar mobility.  No knee effusion noted.  Based on his stiffness with knee flexion today, patient would benefit from manipulation under anesthesia.  Plan to perform this in 2 weeks when patient is about 6 weeks out in order to maximize healing of the press-fit implant prior to stressing with manipulation.  Discussed the risks and benefits of the manipulation as well as what to expect after the procedure.  He understands he will need to do as much flexion as possible and using CPM machine for about 2 weeks after the manipulation.  Medications refilled today.  Follow-up after manipulation.  Follow-Up Instructions: No follow-ups on file.   Orders:  No orders of the defined types were placed in this encounter.  Meds ordered this encounter  Medications   celecoxib (CELEBREX) 100 MG capsule    Sig: Take 1 capsule (100 mg total) by mouth 2 (two) times daily.    Dispense:  60 capsule     Refill:  0   oxyCODONE (OXY IR/ROXICODONE) 5 MG immediate release tablet    Sig: Take 1 tablet (5 mg total) by mouth every 8 (eight) hours as needed for moderate pain (pain score 4-6).    Dispense:  30 tablet    Refill:  0    Imaging: No results found.  PMFS History: Patient Active Problem List   Diagnosis Date Noted   Arthritis of knee, left    S/P total knee arthroplasty, left 12/07/2021   Recurrent right inguinal hernia 01/22/2018   Left inguinal hernia 01/22/2018   Past Medical History:  Diagnosis Date   Left inguinal hernia 01/22/2018   Mixed hyperlipidemia    Pre-diabetes    Recurrent right inguinal hernia 01/22/2018   Sleep apnea    doesn't use CPAP anymore    Family History  Problem Relation Age of Onset   Colon cancer Neg Hx    Colon polyps Neg Hx    Esophageal cancer Neg Hx    Rectal cancer Neg Hx    Stomach cancer Neg Hx     Past Surgical History:  Procedure Laterality Date   INGUINAL HERNIA REPAIR Bilateral 01/22/2018   Procedure: OPEN BILATERAL INGUINAL HERNIA REPAIR ERAS PATHWAY;  Surgeon: Fanny Skates, MD;  Location: [facility];  Service: General;  Laterality: Bilateral;  INSERTION OF MESH Bilateral 01/22/2018   Procedure: INSERTION OF MESH;  Surgeon: Fanny Skates, MD;  Location: [facility];  Service: General;  Laterality: Bilateral;   KNEE SURGERY     TOTAL KNEE ARTHROPLASTY Left 12/07/2021   Procedure: LEFT TOTAL KNEE ARTHROPLASTY;  Surgeon: Meredith Pel, MD;  Location: [facility];  Service: Orthopedics;  Laterality: Left;   Social History   Occupational History   Not on file  Tobacco Use   Smoking status: Never   Smokeless tobacco: Former    Types: Chew   Tobacco comments:    Pt states he never chewed tobacco  Vaping Use   Vaping Use: Never used  Substance and Sexual Activity   Alcohol use: Yes    Alcohol/week: 3.0 standard drinks of alcohol    Types: 3 Cans of beer per week   Drug use: No   Sexual  activity: Not on file

## 2022-01-07 ENCOUNTER — Encounter (HOSPITAL_COMMUNITY): Payer: BC Managed Care – PPO

## 2022-01-11 ENCOUNTER — Encounter (HOSPITAL_COMMUNITY): Payer: BC Managed Care – PPO | Admitting: Physical Therapy

## 2022-01-13 ENCOUNTER — Encounter (HOSPITAL_COMMUNITY): Payer: BC Managed Care – PPO | Admitting: Physical Therapy

## 2022-01-17 ENCOUNTER — Encounter (HOSPITAL_COMMUNITY): Payer: BC Managed Care – PPO | Admitting: Physical Therapy

## 2022-01-18 ENCOUNTER — Encounter (HOSPITAL_COMMUNITY): Payer: Self-pay | Admitting: Orthopedic Surgery

## 2022-01-18 ENCOUNTER — Other Ambulatory Visit: Payer: Self-pay

## 2022-01-18 NOTE — Progress Notes (Signed)
Spoke with pt for pre-op call. Pt just had surgery in June on his left knee, coming in for manipulation of left knee under anesthesia. Pt states nothing has changed with his allergies, medications or medical/surgical history.   Shower instructions given to pt.

## 2022-01-19 ENCOUNTER — Encounter (HOSPITAL_COMMUNITY): Payer: BC Managed Care – PPO

## 2022-01-19 NOTE — Anesthesia Preprocedure Evaluation (Signed)
Anesthesia Evaluation  Patient identified by MRN, date of birth, ID band Patient awake    Reviewed: Allergy & Precautions, NPO status , Patient's Chart, lab work & pertinent test results  Airway Mallampati: II  TM Distance: >3 FB Neck ROM: Full    Dental no notable dental hx.    Pulmonary sleep apnea ,    Pulmonary exam normal breath sounds clear to auscultation       Cardiovascular negative cardio ROS Normal cardiovascular exam Rhythm:Regular Rate:Normal     Neuro/Psych negative neurological ROS  negative psych ROS   GI/Hepatic negative GI ROS, Neg liver ROS,   Endo/Other  negative endocrine ROS  Renal/GU negative Renal ROS     Musculoskeletal  (+) Arthritis ,   Abdominal   Peds  Hematology negative hematology ROS (+)   Anesthesia Other Findings left knee osteoarthritis  Reproductive/Obstetrics                             Anesthesia Physical  Anesthesia Plan  ASA: 2  Anesthesia Plan: General and Regional   Post-op Pain Management: Regional block*, Tylenol PO (pre-op)* and Celebrex PO (pre-op)*   Induction: Intravenous  PONV Risk Score and Plan: 1 and Ondansetron, Dexamethasone, Propofol infusion, Midazolam and Treatment may vary due to age or medical condition  Airway Management Planned: Simple Face Mask, LMA and Mask  Additional Equipment: None  Intra-op Plan:   Post-operative Plan:   Informed Consent: I have reviewed the patients History and Physical, chart, labs and discussed the procedure including the risks, benefits and alternatives for the proposed anesthesia with the patient or authorized representative who has indicated his/her understanding and acceptance.     Dental advisory given  Plan Discussed with: CRNA and Anesthesiologist  Anesthesia Plan Comments: (PAT note written 11/25/2021 by Myra Gianotti, PA-C. DISCUSSION: Patient is a 56 year old male  scheduled for the above procedure.  History includes never smoker, OSA (not using CPAP), pre-diabetes, mixed HLD, hernia (s/p bilateral IHR 01/22/18).)        Anesthesia Quick Evaluation

## 2022-01-20 ENCOUNTER — Encounter (HOSPITAL_COMMUNITY): Payer: Self-pay | Admitting: Orthopedic Surgery

## 2022-01-20 ENCOUNTER — Other Ambulatory Visit: Payer: Self-pay

## 2022-01-20 ENCOUNTER — Ambulatory Visit (HOSPITAL_COMMUNITY): Payer: BC Managed Care – PPO | Admitting: Certified Registered"

## 2022-01-20 ENCOUNTER — Ambulatory Visit (HOSPITAL_COMMUNITY)
Admission: RE | Admit: 2022-01-20 | Discharge: 2022-01-20 | Disposition: A | Discharge status: Home / Self Care | Payer: BC Managed Care – PPO | Attending: Orthopedic Surgery | Admitting: Orthopedic Surgery

## 2022-01-20 ENCOUNTER — Encounter (HOSPITAL_COMMUNITY)
Admission: RE | Disposition: A | Discharge status: Home / Self Care | Payer: Self-pay | Source: Home / Self Care | Attending: Orthopedic Surgery

## 2022-01-20 DIAGNOSIS — M24662 Ankylosis, left knee: Secondary | ICD-10-CM | POA: Diagnosis not present

## 2022-01-20 DIAGNOSIS — G4730 Sleep apnea, unspecified: Secondary | ICD-10-CM | POA: Diagnosis not present

## 2022-01-20 DIAGNOSIS — Z96652 Presence of left artificial knee joint: Secondary | ICD-10-CM | POA: Insufficient documentation

## 2022-01-20 HISTORY — PX: KNEE CLOSED REDUCTION: SHX995

## 2022-01-20 LAB — CBC
HCT: 40.7 % (ref 39.0–52.0)
Hemoglobin: 12.8 g/dL — ABNORMAL LOW (ref 13.0–17.0)
MCH: 27.5 pg (ref 26.0–34.0)
MCHC: 31.4 g/dL (ref 30.0–36.0)
MCV: 87.5 fL (ref 80.0–100.0)
Platelets: 232 10*3/uL (ref 150–400)
RBC: 4.65 MIL/uL (ref 4.22–5.81)
RDW: 13.6 % (ref 11.5–15.5)
WBC: 4.2 10*3/uL (ref 4.0–10.5)
nRBC: 0 % (ref 0.0–0.2)

## 2022-01-20 LAB — BASIC METABOLIC PANEL
Anion gap: 10 (ref 5–15)
BUN: 16 mg/dL (ref 6–20)
CO2: 26 mmol/L (ref 22–32)
Calcium: 9.3 mg/dL (ref 8.9–10.3)
Chloride: 105 mmol/L (ref 98–111)
Creatinine, Ser: 1.04 mg/dL (ref 0.61–1.24)
GFR, Estimated: >60 mL/min (ref >60)
Glucose, Bld: 108 mg/dL — ABNORMAL HIGH (ref 70–99)
Potassium: 4.4 mmol/L (ref 3.5–5.1)
Sodium: 141 mmol/L (ref 135–145)

## 2022-01-20 SURGERY — MANIPULATION, KNEE, CLOSED
Anesthesia: Regional | Site: Knee | Laterality: Left

## 2022-01-20 MED ORDER — FENTANYL CITRATE (PF) 100 MCG/2ML IJ SOLN
25.0000 ug | INTRAMUSCULAR | Status: DC | PRN
  Administered 2022-01-20 (×2): 50 ug via INTRAVENOUS

## 2022-01-20 MED ORDER — ROPIVACAINE HCL 5 MG/ML IJ SOLN
INTRAMUSCULAR | Status: DC | PRN
  Administered 2022-01-20: 25 mL via PERINEURAL

## 2022-01-20 MED ORDER — MIDAZOLAM HCL 2 MG/2ML IJ SOLN
INTRAMUSCULAR | Status: AC
  Filled 2022-01-20: qty 2

## 2022-01-20 MED ORDER — POVIDONE-IODINE 7.5 % EX SOLN
Freq: Once | CUTANEOUS | Status: AC
  Filled 2022-01-20: qty 118

## 2022-01-20 MED ORDER — MORPHINE SULFATE (PF) 4 MG/ML IV SOLN
INTRAVENOUS | Status: AC
  Filled 2022-01-20: qty 2

## 2022-01-20 MED ORDER — DEXAMETHASONE SODIUM PHOSPHATE 10 MG/ML IJ SOLN
INTRAMUSCULAR | Status: DC | PRN
  Administered 2022-01-20: 10 mg via INTRAVENOUS

## 2022-01-20 MED ORDER — CLONIDINE HCL (ANALGESIA) 100 MCG/ML EP SOLN
EPIDURAL | Status: AC
  Filled 2022-01-20: qty 10

## 2022-01-20 MED ORDER — CHLORHEXIDINE GLUCONATE 0.12 % MT SOLN
15.0000 mL | Freq: Once | OROMUCOSAL | Status: AC
  Administered 2022-01-20: 15 mL via OROMUCOSAL
  Filled 2022-01-20: qty 15

## 2022-01-20 MED ORDER — BUPIVACAINE HCL (PF) 0.25 % IJ SOLN
INTRAMUSCULAR | Status: AC
  Filled 2022-01-20: qty 30

## 2022-01-20 MED ORDER — ONDANSETRON HCL 4 MG/2ML IJ SOLN
INTRAMUSCULAR | Status: DC | PRN
  Administered 2022-01-20: 4 mg via INTRAVENOUS

## 2022-01-20 MED ORDER — MEPERIDINE HCL 25 MG/ML IJ SOLN: 6.2500 mg | INTRAMUSCULAR | Status: DC | PRN

## 2022-01-20 MED ORDER — LIDOCAINE 2% (20 MG/ML) 5 ML SYRINGE
INTRAMUSCULAR | Status: AC
  Filled 2022-01-20: qty 5

## 2022-01-20 MED ORDER — POVIDONE-IODINE 10 % EX SWAB
2.0000 | Freq: Once | CUTANEOUS | Status: AC
  Administered 2022-01-20: 2 via TOPICAL

## 2022-01-20 MED ORDER — OXYCODONE HCL 5 MG PO TABS: 5.0000 mg | ORAL_TABLET | Freq: Once | ORAL | Status: DC | PRN

## 2022-01-20 MED ORDER — OXYCODONE HCL 5 MG PO TABS: 5.0000 mg | ORAL_TABLET | ORAL | 0 refills | Status: DC | PRN

## 2022-01-20 MED ORDER — PROPOFOL 10 MG/ML IV BOLUS
INTRAVENOUS | Status: AC
  Filled 2022-01-20: qty 20

## 2022-01-20 MED ORDER — FENTANYL CITRATE (PF) 100 MCG/2ML IJ SOLN
INTRAMUSCULAR | Status: DC | PRN
Start: 2022-01-20 — End: 2022-01-20
  Administered 2022-01-20 (×3): 50 ug via INTRAVENOUS

## 2022-01-20 MED ORDER — FENTANYL CITRATE (PF) 100 MCG/2ML IJ SOLN
INTRAMUSCULAR | Status: AC
  Filled 2022-01-20: qty 2

## 2022-01-20 MED ORDER — LACTATED RINGERS IV SOLN: INTRAVENOUS | Status: DC

## 2022-01-20 MED ORDER — MIDAZOLAM HCL 5 MG/5ML IJ SOLN
INTRAMUSCULAR | Status: DC | PRN
  Administered 2022-01-20: 2 mg via INTRAVENOUS

## 2022-01-20 MED ORDER — ACETAMINOPHEN 160 MG/5ML PO SOLN: 325.0000 mg | ORAL | Status: DC | PRN

## 2022-01-20 MED ORDER — DEXAMETHASONE SODIUM PHOSPHATE 10 MG/ML IJ SOLN
INTRAMUSCULAR | Status: DC | PRN
  Administered 2022-01-20: 10 mg

## 2022-01-20 MED ORDER — ACETAMINOPHEN 325 MG PO TABS: 325.0000 mg | ORAL_TABLET | ORAL | Status: DC | PRN

## 2022-01-20 MED ORDER — TRANEXAMIC ACID-NACL 1000-0.7 MG/100ML-% IV SOLN
1000.0000 mg | INTRAVENOUS | Status: AC
Start: 2022-01-20 — End: 2022-01-20
  Administered 2022-01-20: 1000 mg via INTRAVENOUS
  Filled 2022-01-20: qty 100

## 2022-01-20 MED ORDER — ORAL CARE MOUTH RINSE: 15.0000 mL | Freq: Once | OROMUCOSAL | Status: AC

## 2022-01-20 MED ORDER — CLONIDINE HCL (ANALGESIA) 100 MCG/ML EP SOLN
EPIDURAL | Status: DC | PRN
  Administered 2022-01-20: 28 mL

## 2022-01-20 MED ORDER — FENTANYL CITRATE (PF) 250 MCG/5ML IJ SOLN
INTRAMUSCULAR | Status: AC
  Filled 2022-01-20: qty 5

## 2022-01-20 MED ORDER — ONDANSETRON HCL 4 MG/2ML IJ SOLN
INTRAMUSCULAR | Status: AC
  Filled 2022-01-20: qty 2

## 2022-01-20 MED ORDER — ONDANSETRON HCL 4 MG/2ML IJ SOLN
4.0000 mg | Freq: Once | INTRAMUSCULAR | Status: DC | PRN
Start: 2022-01-20 — End: 2022-01-20

## 2022-01-20 MED ORDER — PROPOFOL 10 MG/ML IV BOLUS
INTRAVENOUS | Status: DC | PRN
  Administered 2022-01-20: 150 mg via INTRAVENOUS

## 2022-01-20 MED ORDER — OXYCODONE HCL 5 MG/5ML PO SOLN: 5.0000 mg | Freq: Once | ORAL | Status: DC | PRN

## 2022-01-20 MED ORDER — DEXAMETHASONE SODIUM PHOSPHATE 10 MG/ML IJ SOLN
INTRAMUSCULAR | Status: AC
  Filled 2022-01-20: qty 1

## 2022-01-20 SURGICAL SUPPLY — 1 items: BNDG ELASTIC 6X10 VLCR STRL LF (GAUZE/BANDAGES/DRESSINGS) ×1 IMPLANT

## 2022-01-20 NOTE — Anesthesia Postprocedure Evaluation (Signed)
Anesthesia Post Note  Patient: Daniel Caldwell  Procedure(s) Performed: LEFT KNEE MANIPULATION UNDER ANESTHESIA (Left: Knee)     Patient location during evaluation: PACU Anesthesia Type: Regional and General Level of consciousness: awake and alert Pain management: pain level controlled Vital Signs Assessment: post-procedure vital signs reviewed and stable Respiratory status: spontaneous breathing, nonlabored ventilation, respiratory function stable and patient connected to nasal cannula oxygen Cardiovascular status: blood pressure returned to baseline and stable Postop Assessment: no apparent nausea or vomiting Anesthetic complications: no   No notable events documented.  Last Vitals:  Vitals:   01/20/22 0815 01/20/22 0830  BP: 117/78 106/73  Pulse: 65 63  Resp: 20 (!) 9  Temp:    SpO2: 100% 100%    Last Pain:  Vitals:   01/20/22 0830  TempSrc:   PainSc: Asleep                 Gerardo Caiazzo

## 2022-01-20 NOTE — Transfer of Care (Signed)
Immediate Anesthesia Transfer of Care Note  Patient: Daniel Caldwell  Procedure(s) Performed: LEFT KNEE MANIPULATION UNDER ANESTHESIA (Left: Knee)  Patient Location: PACU  Anesthesia Type:GA combined with regional for post-op pain  Level of Consciousness: awake, alert  and oriented  Airway & Oxygen Therapy: Patient Spontanous Breathing and Patient connected to face mask oxygen  Post-op Assessment: Report given to RN and Post -op Vital signs reviewed and stable  Post vital signs: Reviewed and stable  Last Vitals:  Vitals Value Taken Time  BP 118/80 01/20/22 0807  Temp 36.4 C 01/20/22 0807  Pulse 64 01/20/22 0813  Resp 13 01/20/22 0813  SpO2 100 % 01/20/22 0813  Vitals shown include unvalidated device data.  Last Pain:  Vitals:   01/20/22 0636  TempSrc:   PainSc: 0-No pain      Patients Stated Pain Goal: 0 (41/42/39 5320)  Complications: No notable events documented.

## 2022-01-20 NOTE — Brief Op Note (Signed)
    01/20/2022  7:59 AM  PATIENT:  Benedetto Coons [place]  56 y.o. male  PRE-OPERATIVE DIAGNOSIS:  left knee arthrofibrosis  POST-OPERATIVE DIAGNOSIS:  left knee arthrofibrosis  PROCEDURE:  Procedure(s): LEFT KNEE MANIPULATION UNDER ANESTHESIA  SURGEON:  Surgeon(s): Marlou Sa, Tonna Corner, MD  ASSISTANT: magnant pa  ANESTHESIA:   general  EBL: 0 ml    Total I/O In: 100 [IV Piggyback:100] Out: -   BLOOD ADMINISTERED: none  DRAINS: none   LOCAL MEDICATIONS USED:  marcaine mso4 clonidine   SPECIMEN:  No Specimen  COUNTS:  YES  TOURNIQUET:  * No tourniquets in log *  DICTATION: .Other Dictation: Dictation Number [id number]  PLAN OF CARE: Discharge to home after PACU  PATIENT DISPOSITION:  PACU - hemodynamically stable

## 2022-01-20 NOTE — H&P (Signed)
Daniel Caldwell is an 56 y.o. male.   Chief Complaint: Left knee pain HPI: Patient is a 56 year old patient underwent left total knee replacement about 6 weeks ago.  He has had trouble regaining flexion.  Presents now for operative management consisting of manipulation under anesthesia after explanation of risks and benefits.  Past Medical History:  Diagnosis Date   Left inguinal hernia 01/22/2018   Mixed hyperlipidemia    Pre-diabetes    Recurrent right inguinal hernia 01/22/2018   Sleep apnea    doesn't use CPAP anymore    Past Surgical History:  Procedure Laterality Date   INGUINAL HERNIA REPAIR Bilateral 01/22/2018   Procedure: OPEN BILATERAL INGUINAL HERNIA REPAIR ERAS PATHWAY;  Surgeon: Fanny Skates, MD;  Location: [facility];  Service: General;  Laterality: Bilateral;   INSERTION OF MESH Bilateral 01/22/2018   Procedure: INSERTION OF MESH;  Surgeon: Fanny Skates, MD;  Location: [facility];  Service: General;  Laterality: Bilateral;   KNEE SURGERY     TOTAL KNEE ARTHROPLASTY Left 12/07/2021   Procedure: LEFT TOTAL KNEE ARTHROPLASTY;  Surgeon: Meredith Pel, MD;  Location: [facility];  Service: Orthopedics;  Laterality: Left;    Family History  Problem Relation Age of Onset   Colon cancer Neg Hx    Colon polyps Neg Hx    Esophageal cancer Neg Hx    Rectal cancer Neg Hx    Stomach cancer Neg Hx    Social History:  reports that he has never smoked. He has quit using smokeless tobacco.  His smokeless tobacco use included chew. He reports current alcohol use of about 3.0 standard drinks of alcohol per week. He reports that he does not use drugs.  Allergies:  Allergies  Allergen Reactions   Penicillins Hives and Other (See Comments)    Unknown Has patient had a PCN reaction causing immediate rash, facial/tongue/throat swelling, SOB or lightheadedness with hypotension: Unknown Has patient had a PCN reaction causing severe rash involving mucus  membranes or skin necrosis: Unknown Has patient had a PCN reaction that required hospitalization: Unknown Has patient had a PCN reaction occurring within the last 10 years: No If all of the above answers are "NO", then may proceed with Cephalosporin use.  TOLERATED ANCEF ON 12/07/2021    Medications Prior to Admission  Medication Sig Dispense Refill   celecoxib (CELEBREX) 100 MG capsule Take 1 capsule (100 mg total) by mouth 2 (two) times daily. 60 capsule 0   clindamycin (CLEOCIN T) 1 % lotion Apply 1 application  topically daily as needed (acne).  2   clobetasol cream (TEMOVATE) 6.76 % Apply 1 application  topically daily as needed (Irritation).  1   Multiple Vitamins-Minerals (QC MENS DAILY MULTIVITAMIN PO) Take 1 tablet by mouth daily.     oxyCODONE (OXY IR/ROXICODONE) 5 MG immediate release tablet Take 1 tablet (5 mg total) by mouth every 8 (eight) hours as needed for moderate pain (pain score 4-6). 30 tablet 0   aspirin 81 MG chewable tablet Chew 1 tablet (81 mg total) by mouth 2 (two) times daily. To prevent blood clots (Patient not taking: Reported on 01/18/2022) 60 tablet 0   methocarbamol (ROBAXIN) 500 MG tablet Take 1 tablet (500 mg total) by mouth every 8 (eight) hours as needed for muscle spasms. (Patient not taking: Reported on 01/18/2022) 30 tablet 1    Results for orders placed or performed during the hospital encounter of 01/20/22 (from the past 48 hour(s))  CBC per  protocol     Status: Abnormal   Collection Time: 01/20/22  5:49 AM  Result Value Ref Range   WBC 4.2 4.0 - 10.5 K/uL   RBC 4.65 4.22 - 5.81 MIL/uL   Hemoglobin 12.8 (L) 13.0 - 17.0 g/dL   HCT 40.7 39.0 - 52.0 %   MCV 87.5 80.0 - 100.0 fL   MCH 27.5 26.0 - 34.0 pg   MCHC 31.4 30.0 - 36.0 g/dL   RDW 13.6 11.5 - 15.5 %   Platelets 232 150 - 400 K/uL   nRBC 0.0 0.0 - 0.2 %    Comment: Performed at [facility], [place] [address], [place], [place] [address]   No results found.  Review of Systems   Musculoskeletal:  Positive for arthralgias.  All other systems reviewed and are negative.   Blood pressure (!) 146/75, pulse 74, temperature 98.2 F (36.8 C), temperature source Oral, resp. rate 18, height '5\' 10"'$  (1.778 m), weight 74.8 kg, SpO2 100 %. Physical Exam Vitals reviewed.  HENT:     Head: Normocephalic.     Nose: Nose normal.     Mouth/Throat:     Mouth: Mucous membranes are moist.  Eyes:     Pupils: Pupils are equal, round, and reactive to light.  Cardiovascular:     Rate and Rhythm: Normal rate.     Pulses: Normal pulses.  Pulmonary:     Effort: Pulmonary effort is normal.  Abdominal:     General: Abdomen is flat.  Musculoskeletal:     Cervical back: Normal range of motion.  Skin:    General: Skin is warm.     Capillary Refill: Capillary refill takes less than 2 seconds.  Neurological:     General: No focal deficit present.     Mental Status: He is alert.  Psychiatric:        Mood and Affect: Mood normal.   Examination of the left knee demonstrates well-healed incision with no effusion.  Patient can flex to about 60 degrees with relatively solid endpoint.  Extensor mechanism intact.  Assessment/Plan Impression is left knee arthrofibrosis following knee replacement.  Plan is left knee manipulation under anesthesia with injection.  Risk and benefits are discussed including not limited to persistent loss of range of motion fracture as well as incomplete restoration of function.  Patient understands risk and benefits and wishes to proceed.  All questions answered  Anderson Malta, MD 01/20/2022, 6:42 AM

## 2022-01-20 NOTE — Op Note (Signed)
NAMENYLES, MITTON MEDICAL RECORD NO: [id number] ACCOUNT NO: [id number] DATE OF BIRTH: 1965-09-25 FACILITY: [facility] LOCATION: [facility] PHYSICIAN: Yetta Barre. Marlou Sa, MD  Operative Report   DATE OF PROCEDURE: 01/20/2022   PREOPERATIVE DIAGNOSIS:  Left knee arthrofibrosis.  POSTOPERATIVE DIAGNOSIS:  Left knee arthrofibrosis.  PROCEDURE:  Left knee manipulation under anesthesia.  INDICATIONS:  The patient is a 56 year old patient who is about 6 weeks out from left total knee replacement.  He has had trouble getting flexion.  She presents now for manipulation under anesthesia after failure of physical therapy and plateauing,  motion and physical therapy.  DESCRIPTION OF PROCEDURE:  The patient was brought to the operating room where general anesthetic was induced.  Timeout was called.  Left leg was examined under anesthesia and found to have flexion to about 70 degrees.  Solid endpoint.  After calling  timeout, the knee was manipulated minimizing the fulcrum length of the tibia.  Achieved approximately 130 degrees of flexion.  The patient did have less than 5 degrees of flexion contracture.  Knee remained stable after manipulation.  The area was then  prepped with alcohol, Betadine was allowed to air dry, then DuraPrep solution and then 30 mL of 0.25% Marcaine with epinephrine, 8 mg of morphine and 1 mL of clonidine injected into the knee for postop pain relief. Band-Aid and Ace wrap applied.  The  patient tolerated the procedure well without immediate complication.  Transferred to recovery room in stable condition.      [email or web address] D: 01/20/2022 8:02:03 am T: 01/20/2022 8:19:00 am  JOB: [id number]/ [id number]

## 2022-01-20 NOTE — Anesthesia Procedure Notes (Addendum)
  Anesthesia Regional Block: Adductor canal block   Pre-Anesthetic Checklist: , timeout performed,  Correct Patient, Correct Site, Correct Laterality,  Correct Procedure, Correct Position, site marked,  Risks and benefits discussed,  Surgical consent,  Pre-op evaluation,  At surgeon's request and post-op pain management  Laterality: Left  Prep: chloraprep       Needles:  Injection technique: Single-shot  Needle Type: Echogenic Stimulator Needle     Needle Length: 5cm  Needle Gauge: 22     Additional Needles:   Procedures:, nerve stimulator,,, ultrasound used (permanent image in chart),,    Narrative:  Start time: 01/20/2022 7:15 AM End time: 01/20/2022 7:20 AM Injection made incrementally with aspirations every 5 mL.  Performed by: Personally  Anesthesiologist: Janeece Riggers, MD  Additional Notes: Functioning IV was confirmed and monitors were applied.  A 54m 22ga Arrow echogenic stimulator needle was used. Sterile prep and drape,hand hygiene and sterile gloves were used. Ultrasound guidance: relevant anatomy identified, needle position confirmed, local anesthetic spread visualized around nerve(s)., vascular puncture avoided.  Image printed for medical record. Negative aspiration and negative test dose prior to incremental administration of local anesthetic. The patient tolerated the procedure well.

## 2022-01-20 NOTE — Anesthesia Procedure Notes (Signed)
Procedure Name: LMA Insertion Date/Time: 01/20/2022 7:43 AM  Performed by: Lavonia Dana, CRNAPre-anesthesia Checklist: Patient identified, Emergency Drugs available, Suction available and Patient being monitored Patient Re-evaluated:Patient Re-evaluated prior to induction Oxygen Delivery Method: Circle system utilized Preoxygenation: Pre-oxygenation with 100% oxygen Induction Type: IV induction Ventilation: Mask ventilation without difficulty LMA: LMA inserted LMA Size: 4.0 Number of attempts: 1 Airway Equipment and Method: Bite block Placement Confirmation: positive ETCO2 Tube secured with: Tape Dental Injury: Teeth and Oropharynx as per pre-operative assessment

## 2022-01-21 ENCOUNTER — Encounter (HOSPITAL_COMMUNITY): Payer: Self-pay | Admitting: Orthopedic Surgery

## 2022-01-23 DIAGNOSIS — M24662 Ankylosis, left knee: Secondary | ICD-10-CM

## 2022-01-26 ENCOUNTER — Encounter (HOSPITAL_COMMUNITY): Payer: BC Managed Care – PPO

## 2022-01-28 ENCOUNTER — Ambulatory Visit (INDEPENDENT_AMBULATORY_CARE_PROVIDER_SITE_OTHER): Payer: BC Managed Care – PPO | Admitting: Orthopedic Surgery

## 2022-01-28 ENCOUNTER — Encounter: Payer: Self-pay | Admitting: Orthopedic Surgery

## 2022-01-28 DIAGNOSIS — M24662 Ankylosis, left knee: Secondary | ICD-10-CM

## 2022-01-28 DIAGNOSIS — Z96652 Presence of left artificial knee joint: Secondary | ICD-10-CM

## 2022-01-28 NOTE — Progress Notes (Signed)
Post-Op Visit Note   Patient: Daniel Caldwell           Date of Birth: 03/02/1966           MRN: [id number] Visit Date: 01/28/2022 PCP: Eber Hong, MD   Assessment & Plan:  Chief Complaint:  Chief Complaint  Patient presents with   Left Knee - Routine Post Op   Visit Diagnoses:  1. S/P total knee arthroplasty, left   2. Arthrofibrosis of knee joint, left     Plan: Daniel Caldwell is a 56 year old patient who underwent left TKA 12/07/2021 and left MUA 01/20/2022.  Ambulates with a cane.  States he still is stiff.  We did get him to about 130 of flexion with the manipulation.  Takes 1 oxycodone at night.  On examination he has stiffened up.  He is essentially where he was prior to surgery.  I encouraged patient to work on achieving as much flexion as possible through squatting and through home exercise program as well as to work on full extension.  Encouraged him that at this point 6 weeks out after manipulation with this level of stiffness that it would likely require him on his part at least 4 to 6 hours/day of nothing but dedicated rehabilitative effort.  3-week return for clinical recheck  Follow-Up Instructions: Return in about 3 weeks (around 02/18/2022).   Orders:  No orders of the defined types were placed in this encounter.  No orders of the defined types were placed in this encounter.   Imaging: No results found.  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   Recurrent right inguinal hernia 01/22/2018   Left inguinal hernia 01/22/2018   Past Medical History:  Diagnosis Date   Left inguinal hernia 01/22/2018   Mixed hyperlipidemia    Pre-diabetes    Recurrent right inguinal hernia 01/22/2018   Sleep apnea    doesn't use CPAP anymore    Family History  Problem Relation Age of Onset   Colon cancer Neg Hx    Colon polyps Neg Hx     Esophageal cancer Neg Hx    Rectal cancer Neg Hx    Stomach cancer Neg Hx     Past Surgical History:  Procedure Laterality Date   INGUINAL HERNIA REPAIR Bilateral 01/22/2018   Procedure: OPEN BILATERAL INGUINAL HERNIA REPAIR ERAS PATHWAY;  Surgeon: Fanny Skates, MD;  Location: [facility];  Service: General;  Laterality: Bilateral;   INSERTION OF MESH Bilateral 01/22/2018   Procedure: INSERTION OF MESH;  Surgeon: Fanny Skates, MD;  Location: [facility];  Service: General;  Laterality: Bilateral;   KNEE CLOSED REDUCTION Left 01/20/2022   Procedure: LEFT KNEE MANIPULATION UNDER ANESTHESIA;  Surgeon: Meredith Pel, MD;  Location: [facility];  Service: Orthopedics;  Laterality: Left;   KNEE SURGERY     TOTAL KNEE ARTHROPLASTY Left 12/07/2021   Procedure: LEFT TOTAL KNEE ARTHROPLASTY;  Surgeon: Meredith Pel, MD;  Location: [facility];  Service: Orthopedics;  Laterality: Left;   Social History   Occupational History   Not on file  Tobacco Use   Smoking status: Never   Smokeless tobacco: Former    Types: Chew   Tobacco comments:    Pt states he never chewed tobacco  Vaping Use   Vaping Use: Never used  Substance and Sexual  Activity   Alcohol use: Yes    Alcohol/week: 3.0 standard drinks of alcohol    Types: 3 Cans of beer per week   Drug use: No   Sexual activity: Not on file

## 2022-01-31 ENCOUNTER — Encounter (HOSPITAL_COMMUNITY): Payer: BC Managed Care – PPO

## 2022-02-02 ENCOUNTER — Encounter (HOSPITAL_COMMUNITY): Payer: BC Managed Care – PPO | Admitting: Physical Therapy

## 2022-02-04 ENCOUNTER — Encounter (HOSPITAL_COMMUNITY): Payer: BC Managed Care – PPO

## 2022-02-06 ENCOUNTER — Other Ambulatory Visit: Payer: Self-pay | Admitting: Surgical

## 2022-02-07 ENCOUNTER — Encounter (HOSPITAL_COMMUNITY): Payer: BC Managed Care – PPO | Admitting: Physical Therapy

## 2022-02-10 ENCOUNTER — Encounter (HOSPITAL_COMMUNITY): Payer: BC Managed Care – PPO | Admitting: Physical Therapy

## 2022-02-15 ENCOUNTER — Encounter (HOSPITAL_COMMUNITY): Payer: BC Managed Care – PPO

## 2022-02-17 ENCOUNTER — Encounter (HOSPITAL_COMMUNITY): Payer: BC Managed Care – PPO | Admitting: Physical Therapy

## 2022-02-18 ENCOUNTER — Ambulatory Visit (INDEPENDENT_AMBULATORY_CARE_PROVIDER_SITE_OTHER): Payer: BC Managed Care – PPO | Admitting: Surgical

## 2022-02-18 DIAGNOSIS — Z96652 Presence of left artificial knee joint: Secondary | ICD-10-CM

## 2022-02-20 ENCOUNTER — Encounter: Payer: Self-pay | Admitting: Orthopedic Surgery

## 2022-02-20 NOTE — Progress Notes (Signed)
Post-Op Visit Note   Patient: Daniel Caldwell           Date of Birth: Mar 03, 1966           MRN: [id number] Visit Date: 02/18/2022 PCP: Eber Hong, MD   Assessment & Plan:  Chief Complaint:  Chief Complaint  Patient presents with   Left Knee - Follow-up     left TKA 12/07/2021 and left MUA 01/20/2022   Visit Diagnoses:  1. S/P total knee arthroplasty, left     Plan: Patient is a 56 year old male who presents s/p left total knee arthroplasty on 12/07/2021 and subsequent left knee manipulation under anesthesia on 01/20/2022.  Patient reports that he does not really have any complaints aside from the stiffness of the knee.  Pain is fairly controlled taking occasional oxycodone and Celebrex.  He is working out and doing exercises at [employer] and is no longer doing formal physical therapy.  He is able to get up and down stairs without difficulty.  He is also able to get up from a low chair or toilet without difficulty.  On exam, he has 5 degrees extension and 50 degrees of knee flexion.  No effusion noted.  Incision is well-healed.  No calf tenderness.  Negative Homans' sign.  Able to perform straight leg raise with excellent quad strength.  Despite the initial success of the manipulation, patient's range of motion is basically back to what it was prior to the manipulation.  Strongly encouraged him to continue working on achieving as much knee flexion as possible.  He would like to return to work on 03/21/2022 and return to work note was given.  He will follow-up in 6 weeks which will mark about 2 weeks out from his return to work so we can evaluate how he is doing and recheck his range of motion.  Follow-Up Instructions: No follow-ups on file.   Orders:  No orders of the defined types were placed in this encounter.  No orders of the defined types were placed in this encounter.   Imaging: No results found.  PMFS History: Patient Active Problem List   Diagnosis Date Noted   Arthrofibrosis  of knee joint, left    Arthritis of knee, left    S/P total knee arthroplasty, left 12/07/2021   Recurrent right inguinal hernia 01/22/2018   Left inguinal hernia 01/22/2018   Past Medical History:  Diagnosis Date   Left inguinal hernia 01/22/2018   Mixed hyperlipidemia    Pre-diabetes    Recurrent right inguinal hernia 01/22/2018   Sleep apnea    doesn't use CPAP anymore    Family History  Problem Relation Age of Onset   Colon cancer Neg Hx    Colon polyps Neg Hx    Esophageal cancer Neg Hx    Rectal cancer Neg Hx    Stomach cancer Neg Hx     Past Surgical History:  Procedure Laterality Date   INGUINAL HERNIA REPAIR Bilateral 01/22/2018   Procedure: OPEN BILATERAL INGUINAL HERNIA REPAIR ERAS PATHWAY;  Surgeon: Fanny Skates, MD;  Location: [place];  Service: General;  Laterality: Bilateral;   INSERTION OF MESH Bilateral 01/22/2018   Procedure: INSERTION OF MESH;  Surgeon: Fanny Skates, MD;  Location: [place];  Service: General;  Laterality: Bilateral;   KNEE CLOSED REDUCTION Left 01/20/2022   Procedure: LEFT KNEE MANIPULATION UNDER ANESTHESIA;  Surgeon: Meredith Pel, MD;  Location: [place] [place];  Service: Orthopedics;  Laterality: Left;  KNEE SURGERY     TOTAL KNEE ARTHROPLASTY Left 12/07/2021   Procedure: LEFT TOTAL KNEE ARTHROPLASTY;  Surgeon: Meredith Pel, MD;  Location: [place];  Service: Orthopedics;  Laterality: Left;   Social History   Occupational History   Not on file  Tobacco Use   Smoking status: Never   Smokeless tobacco: Former    Types: Chew   Tobacco comments:    Pt states he never chewed tobacco  Vaping Use   Vaping Use: Never used  Substance and Sexual Activity   Alcohol use: Yes    Alcohol/week: 3.0 standard drinks of alcohol    Types: 3 Cans of beer per week   Drug use: No   Sexual activity: Not on file

## 2022-02-21 ENCOUNTER — Telehealth: Payer: Self-pay | Admitting: Orthopedic Surgery

## 2022-02-21 NOTE — Telephone Encounter (Signed)
[employer] form received. To [employer].

## 2022-02-23 ENCOUNTER — Telehealth: Payer: Self-pay | Admitting: Surgical

## 2022-02-23 NOTE — Telephone Encounter (Signed)
Received $25.00 check from patient/Forwarding to [employer] today

## 2022-03-17 ENCOUNTER — Telehealth: Payer: Self-pay | Admitting: Surgical

## 2022-03-17 ENCOUNTER — Other Ambulatory Visit: Payer: Self-pay | Admitting: Surgical

## 2022-03-17 MED ORDER — OXYCODONE HCL 5 MG PO TABS: 5.0000 mg | ORAL_TABLET | Freq: Every day | ORAL | 0 refills | Status: DC | PRN

## 2022-03-17 NOTE — Telephone Encounter (Signed)
Sent in refill. This is last refill as I discussed with him at last visit

## 2022-03-17 NOTE — Telephone Encounter (Signed)
Patient called. He would like a refill on oxycodone. His call back number is [phone]

## 2022-03-17 NOTE — Telephone Encounter (Signed)
IC LM for patient advising.  

## 2022-04-01 ENCOUNTER — Ambulatory Visit: Payer: BC Managed Care – PPO | Admitting: Surgical

## 2022-04-01 MED ORDER — CELECOXIB 100 MG PO CAPS: 100.0000 mg | ORAL_CAPSULE | Freq: Two times a day (BID) | ORAL | 2 refills | Status: DC

## 2022-06-01 ENCOUNTER — Ambulatory Visit (INDEPENDENT_AMBULATORY_CARE_PROVIDER_SITE_OTHER): Payer: BC Managed Care – PPO | Admitting: Orthopedic Surgery

## 2022-06-01 DIAGNOSIS — M24662 Ankylosis, left knee: Secondary | ICD-10-CM

## 2022-06-02 ENCOUNTER — Encounter: Payer: Self-pay | Admitting: Orthopedic Surgery

## 2022-06-02 NOTE — Progress Notes (Signed)
Office Visit Note   Patient: Daniel Caldwell           Date of Birth: 1966/05/19           MRN: [id number] Visit Date: 06/01/2022 Requested by: Eber Hong, [place] [address] PCP: Eber Hong, MD  Subjective: Chief Complaint  Patient presents with   Left Knee - Follow-up    HPI: Daniel Caldwell is a 56 y.o. male who presents to the office reporting left knee stiffness.  He has been trying to bend it.  Patient is back at work.  Taking Celebrex daily at work and oxycodone as needed.  Overall the knee had good range of motion after the manipulation under anesthesia but the scar tissue reformed and currently he has diminished range of motion..                ROS: All systems reviewed are negative as they relate to the chief complaint within the history of present illness.  Patient denies fevers or chills.  Assessment & Plan: Visit Diagnoses:  1. Arthrofibrosis of knee joint, left     Plan: Impression is left knee arthrofibrosis.  No evidence of infection.  Patella mobility decreased.  Overall alignment of the prosthesis intact.  Having little bit of tenderness around the fibular head region.  This region looks okay on plain radiographs.  Ankle dorsiflexion intact.  No effusion in the knee.  Plan is observation for now.  I think realistically the only thing left to do for this refractory arthrofibrosis and flexion would be open scar excision with 2-3 night hospital stay to really work on achieving more than 90 degrees of flexion.  He will consider his options.  61-monthreturn for clinical recheck.  Follow-Up Instructions: No follow-ups on file.   Orders:  No orders of the defined types were placed in this encounter.  No orders of the defined types were placed in this encounter.     Procedures: No procedures performed   Clinical Data: No additional findings.  Objective: Vital Signs: There were no vitals taken for this visit.  Physical Exam:   Constitutional: Patient appears well-developed HEENT:  Head: Normocephalic Eyes:EOM are normal Neck: Normal range of motion Cardiovascular: Normal rate Pulmonary/chest: Effort normal Neurologic: Patient is alert Skin: Skin is warm Psychiatric: Patient has normal mood and affect  Ortho Exam: Ortho exam demonstrates no effusion in the left knee.  He lacks about 5 degrees of full extension and bends to about 40.  Patella mobility is decreased.  Extensor mechanism intact.  No masses lymphadenopathy or skin changes noted in that left knee region.  No groin pain with internal/external rotation of the leg.  Specialty Comments:  No specialty comments available.  Imaging: No results found.   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   Recurrent right inguinal hernia 01/22/2018   Left inguinal hernia 01/22/2018   Past Medical History:  Diagnosis Date   Left inguinal hernia 01/22/2018   Mixed hyperlipidemia    Pre-diabetes    Recurrent right inguinal hernia 01/22/2018   Sleep apnea    doesn't use CPAP anymore    Family History  Problem Relation Age of Onset   Colon cancer Neg Hx    Colon polyps Neg Hx    Esophageal cancer Neg Hx  Rectal cancer Neg Hx    Stomach cancer Neg Hx     Past Surgical History:  Procedure Laterality Date   INGUINAL HERNIA REPAIR Bilateral 01/22/2018   Procedure: OPEN BILATERAL INGUINAL HERNIA REPAIR ERAS PATHWAY;  Surgeon: Fanny Skates, MD;  Location: [facility];  Service: General;  Laterality: Bilateral;   INSERTION OF MESH Bilateral 01/22/2018   Procedure: INSERTION OF MESH;  Surgeon: Fanny Skates, MD;  Location: [place];  Service: General;  Laterality: Bilateral;   KNEE CLOSED REDUCTION Left 01/20/2022   Procedure: LEFT KNEE MANIPULATION UNDER ANESTHESIA;  Surgeon: Meredith Pel, MD;  Location: [facility];  Service: Orthopedics;  Laterality: Left;   KNEE SURGERY     TOTAL KNEE ARTHROPLASTY Left 12/07/2021   Procedure: LEFT TOTAL KNEE ARTHROPLASTY;  Surgeon: Meredith Pel, MD;  Location: [facility];  Service: Orthopedics;  Laterality: Left;   Social History   Occupational History   Not on file  Tobacco Use   Smoking status: Never   Smokeless tobacco: Former    Types: Chew   Tobacco comments:    Pt states he never chewed tobacco  Vaping Use   Vaping Use: Never used  Substance and Sexual Activity   Alcohol use: Yes    Alcohol/week: 3.0 standard drinks of alcohol    Types: 3 Cans of beer per week   Drug use: No   Sexual activity: Not on file

## 2022-08-24 ENCOUNTER — Telehealth: Payer: Self-pay | Admitting: Orthopedic Surgery

## 2022-08-24 ENCOUNTER — Other Ambulatory Visit: Payer: Self-pay | Admitting: Orthopedic Surgery

## 2022-08-24 MED ORDER — OXYCODONE HCL 5 MG PO CAPS: 5.0000 mg | ORAL_CAPSULE | Freq: Three times a day (TID) | ORAL | 0 refills | Status: DC | PRN

## 2022-08-24 NOTE — Telephone Encounter (Signed)
Ok to rf pls cla lthx

## 2022-08-24 NOTE — Telephone Encounter (Signed)
Patient called. He would like Oxycodone called in to [facility] on [address] in [place]. His call back number is [phone]

## 2022-08-24 NOTE — Telephone Encounter (Signed)
Done thx

## 2022-09-05 ENCOUNTER — Ambulatory Visit: Payer: BC Managed Care – PPO | Admitting: Orthopedic Surgery

## 2022-09-26 ENCOUNTER — Encounter: Payer: Self-pay | Admitting: Orthopedic Surgery

## 2022-09-26 ENCOUNTER — Other Ambulatory Visit (INDEPENDENT_AMBULATORY_CARE_PROVIDER_SITE_OTHER): Payer: BC Managed Care – PPO

## 2022-09-26 ENCOUNTER — Ambulatory Visit: Payer: BC Managed Care – PPO | Admitting: Orthopedic Surgery

## 2022-09-26 VITALS — Ht 70.0 in | Wt 175.0 lb

## 2022-09-26 DIAGNOSIS — Z96652 Presence of left artificial knee joint: Secondary | ICD-10-CM

## 2022-10-02 NOTE — Progress Notes (Signed)
Office Visit Note   Patient: Daniel Caldwell           Date of Birth: 1965-10-09           MRN: [id number] Visit Date: 09/26/2022 Requested by: Eber Hong, [place] [address] PCP: Eber Hong, MD  Subjective: Chief Complaint  Patient presents with   Left Knee - Follow-up    07/23/2021 Left knee MUA 12/07/2021 Left TKA    HPI: Daniel Caldwell is a 57 y.o. male who presents to the office reporting left knee pain.  He had left total knee replacement 12/07/2021.  Manipulation under anesthesia 01/20/2022.  Has really had difficulty regaining range of motion.  Still works in a very difficult job 12 hours a day at the [employer].  Uses oxycodone to sleep at times.  Celebrex also when working..                ROS: All systems reviewed are negative as they relate to the chief complaint within the history of present illness.  Patient denies fevers or chills.  Assessment & Plan: Visit Diagnoses:  1. S/P total knee arthroplasty, left     Plan: Impression is left knee arthrofibrosis.  We did get good range of motion after the knee replacement as well as after the manipulation but the scar tissue recurred.  Discussed need interventions which for him would likely take the form of arthroscopy with reestablishment of the suprapatella pouch and medial and lateral releases.  That may or may not have a chance to restore his flexion.  He will follow-up as needed but he is going to think about this for a few months.  He may need to have his Percocet occasionally refilled to help him with sleep.  Follow-Up Instructions: No follow-ups on file.   Orders:  Orders Placed This Encounter  Procedures   XR Knee 1-2 Views Left   No orders of the defined types were placed in this encounter.     Procedures: No procedures performed   Clinical Data: No additional findings.  Objective: Vital Signs: Ht 5\' 10"  (1.778 m)   Wt 175 lb (79.4 kg)   BMI 25.11 kg/m   Physical Exam:   Constitutional: Patient appears well-developed HEENT:  Head: Normocephalic Eyes:EOM are normal Neck: Normal range of motion Cardiovascular: Normal rate Pulmonary/chest: Effort normal Neurologic: Patient is alert Skin: Skin is warm Psychiatric: Patient has normal mood and affect  Ortho Exam: Ortho exam demonstrates range of motion of 10-15 on the left knee.  No warmth or effusion in the left knee.  Pedal pulses palpable.  No masses lymphadenopathy or skin changes noted in that left knee region  Specialty Comments:  No specialty comments available.  Imaging: No results found.   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   Recurrent right inguinal hernia 01/22/2018   Left inguinal hernia 01/22/2018   Past Medical History:  Diagnosis Date   Left inguinal hernia 01/22/2018   Mixed hyperlipidemia    Pre-diabetes    Recurrent right inguinal hernia 01/22/2018   Sleep apnea    doesn't use CPAP anymore    Family History  Problem Relation Age of Onset   Colon cancer Neg Hx    Colon polyps Neg Hx    Esophageal cancer Neg Hx  Rectal cancer Neg Hx    Stomach cancer Neg Hx     Past Surgical History:  Procedure Laterality Date   INGUINAL HERNIA REPAIR Bilateral 01/22/2018   Procedure: OPEN BILATERAL INGUINAL HERNIA REPAIR ERAS PATHWAY;  Surgeon: Fanny Skates, MD;  Location: [place];  Service: General;  Laterality: Bilateral;   INSERTION OF MESH Bilateral 01/22/2018   Procedure: INSERTION OF MESH;  Surgeon: Fanny Skates, MD;  Location: [place];  Service: General;  Laterality: Bilateral;   KNEE CLOSED REDUCTION Left 01/20/2022   Procedure: LEFT KNEE MANIPULATION UNDER ANESTHESIA;  Surgeon: Meredith Pel, MD;  Location: [place];  Service: Orthopedics;  Laterality: Left;   KNEE SURGERY     TOTAL KNEE  ARTHROPLASTY Left 12/07/2021   Procedure: LEFT TOTAL KNEE ARTHROPLASTY;  Surgeon: Meredith Pel, MD;  Location: [place];  Service: Orthopedics;  Laterality: Left;   Social History   Occupational History   Not on file  Tobacco Use   Smoking status: Never   Smokeless tobacco: Former    Types: Chew   Tobacco comments:    Pt states he never chewed tobacco  Vaping Use   Vaping Use: Never used  Substance and Sexual Activity   Alcohol use: Yes    Alcohol/week: 3.0 standard drinks of alcohol    Types: 3 Cans of beer per week   Drug use: No   Sexual activity: Not on file

## 2023-02-24 ENCOUNTER — Telehealth: Payer: Self-pay | Admitting: Orthopedic Surgery

## 2023-02-24 ENCOUNTER — Other Ambulatory Visit: Payer: Self-pay | Admitting: Surgical

## 2023-02-24 MED ORDER — OXYCODONE HCL 5 MG PO TABS: 5.0000 mg | ORAL_TABLET | Freq: Every evening | ORAL | 0 refills | Status: AC | PRN

## 2023-02-24 MED ORDER — CELECOXIB 100 MG PO CAPS: 100.0000 mg | ORAL_CAPSULE | Freq: Two times a day (BID) | ORAL | 2 refills | Status: AC

## 2023-02-24 NOTE — Telephone Encounter (Signed)
Sent in one time refill per dr dean's last note. If he needs another refill of oxycodone, he will need to see dr dean in person

## 2023-02-24 NOTE — Telephone Encounter (Signed)
Patient called and needs a refill on Oxycodone and celecoxib for pain in his knees. CB#[phone]

## 2023-02-24 NOTE — Telephone Encounter (Signed)
IC to advise per [facility]

## 2023-03-22 ENCOUNTER — Ambulatory Visit: Payer: BC Managed Care – PPO | Admitting: Orthopedic Surgery

## 2023-03-22 ENCOUNTER — Telehealth: Payer: Self-pay

## 2023-03-22 ENCOUNTER — Other Ambulatory Visit (INDEPENDENT_AMBULATORY_CARE_PROVIDER_SITE_OTHER): Payer: BC Managed Care – PPO

## 2023-03-22 ENCOUNTER — Encounter: Payer: Self-pay | Admitting: Orthopedic Surgery

## 2023-03-22 DIAGNOSIS — M25561 Pain in right knee: Secondary | ICD-10-CM

## 2023-03-22 DIAGNOSIS — M1711 Unilateral primary osteoarthritis, right knee: Secondary | ICD-10-CM | POA: Diagnosis not present

## 2023-03-22 NOTE — Telephone Encounter (Signed)
Auth needed for right knee gel to be done in 3 weeks

## 2023-03-22 NOTE — Progress Notes (Unsigned)
Office Visit Note   Patient: Daniel Caldwell           Date of Birth: December 03, 1965           MRN: [id number] Visit Date: 03/22/2023 Requested by: Kathlee Nations, MD [id number] [place],  [place] [address] PCP: Kathlee Nations, MD  Subjective: Chief Complaint  Patient presents with   Right Knee - Pain    HPI: Daniel Caldwell is a 57 y.o. male who presents to the office reporting right knee pain of several months duration.  Denies any history of injury.  He does do very physical work at [employer] with 12-hour shifts standing on his legs.  Has history of left total knee replacement complicated by arthrofibrosis.  He reports that the pain on the right knee wakes him from sleep.  Increases in pain are noted after he works.  Does report some locking and popping symptoms..                ROS: All systems reviewed are negative as they relate to the chief complaint within the history of present illness.  Patient denies fevers or chills.  Assessment & Plan: Visit Diagnoses:  1. Right knee pain, unspecified chronicity     Plan: Impression is right knee pain with some medial compartment arthritis and likely worse arthritis and is visible on plain radiographs.  No evidence of osteochondral lesion which is what he had on his left side.  Plan is aspiration and injection of the right knee today and we will preapproved gel shot. This patient is diagnosed with osteoarthritis of the knee(s).    Radiographs show evidence of joint space narrowing, osteophytes, subchondral sclerosis and/or subchondral cysts.  This patient has knee pain which interferes with functional and activities of daily living.    This patient has experienced inadequate response, adverse effects and/or intolerance with conservative treatments such as acetaminophen, NSAIDS, topical creams, physical therapy or regular exercise, knee bracing and/or weight loss.   This patient has experienced inadequate response or has a contraindication to  intra articular steroid injections for at least 3 months.   This patient is not scheduled to have a total knee replacement within 6 months of starting treatment with viscosupplementation.   Follow-Up Instructions: No follow-ups on file.   Orders:  Orders Placed This Encounter  Procedures   XR KNEE 3 VIEW RIGHT   No orders of the defined types were placed in this encounter.     Procedures: Large Joint Inj: R knee on 03/22/2023 10:19 PM Indications: diagnostic evaluation, joint swelling and pain Details: 18 G 1.5 in needle, superolateral approach  Arthrogram: No  Medications: 5 mL lidocaine 1 %; 40 mg methylPREDNISolone acetate 40 MG/ML; 4 mL bupivacaine 0.25 % Outcome: tolerated well, no immediate complications Procedure, treatment alternatives, risks and benefits explained, specific risks discussed. Consent was given by the patient. Immediately prior to procedure a time out was called to verify the correct patient, procedure, equipment, support staff and site/side marked as required. Patient was prepped and draped in the usual sterile fashion.       Clinical Data: No additional findings.  Objective: Vital Signs: There were no vitals taken for this visit.  Physical Exam:  Constitutional: Patient appears well-developed HEENT:  Head: Normocephalic Eyes:EOM are normal Neck: Normal range of motion Cardiovascular: Normal rate Pulmonary/chest: Effort normal Neurologic: Patient is alert Skin: Skin is warm Psychiatric: Patient has normal mood and affect  Ortho Exam: Ortho exam demonstrates right knee range  of motion of 0-1 05.  Mild effusion is present.  Collateral crucial ligaments are stable.  Medial greater than lateral joint line tenderness.  Mild patellofemoral crepitus is present.  Pedal pulses palpable.  Ankle dorsiflexion intact.  No groin pain with internal/external rotation of the leg  Specialty Comments:  No specialty comments available.  Imaging: XR KNEE 3  VIEW RIGHT  Result Date: 03/22/2023 AP lateral merchant radiographs right knee reviewed.  Moderately severe medial compartment arthritis and moderate lateral and mild patellofemoral compartment arthritis is present.  Mild varus alignment.  No acute fracture.    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   Recurrent right inguinal hernia 01/22/2018   Left inguinal hernia 01/22/2018   Past Medical History:  Diagnosis Date   Left inguinal hernia 01/22/2018   Mixed hyperlipidemia    Pre-diabetes    Recurrent right inguinal hernia 01/22/2018   Sleep apnea    doesn't use CPAP anymore    Family History  Problem Relation Age of Onset   Colon cancer Neg Hx    Colon polyps Neg Hx    Esophageal cancer Neg Hx    Rectal cancer Neg Hx    Stomach cancer Neg Hx     Past Surgical History:  Procedure Laterality Date   INGUINAL HERNIA REPAIR Bilateral 01/22/2018   Procedure: OPEN BILATERAL INGUINAL HERNIA REPAIR ERAS PATHWAY;  Surgeon: Claud Kelp, MD;  Location: [facility];  Service: General;  Laterality: Bilateral;   INSERTION OF MESH Bilateral 01/22/2018   Procedure: INSERTION OF MESH;  Surgeon: Claud Kelp, MD;  Location: [facility];  Service: General;  Laterality: Bilateral;   KNEE CLOSED REDUCTION Left 01/20/2022   Procedure: LEFT KNEE MANIPULATION UNDER ANESTHESIA;  Surgeon: Cammy Copa, MD;  [facility];  Service: Orthopedics;  Laterality: Left;   KNEE SURGERY     TOTAL KNEE ARTHROPLASTY Left 12/07/2021   Procedure: LEFT TOTAL KNEE ARTHROPLASTY;  Surgeon: Cammy Copa, MD;  [facility];  Service: Orthopedics;  Laterality: Left;   Social History   Occupational History   Not on file  Tobacco Use   Smoking status: Never   Smokeless tobacco: Former    Types: Chew   Tobacco comments:    Pt  states he never chewed tobacco  Vaping Use   Vaping status: Never Used  Substance and Sexual Activity   Alcohol use: Yes    Alcohol/week: 3.0 standard drinks of alcohol    Types: 3 Cans of beer per week   Drug use: No   Sexual activity: Not on file

## 2023-03-23 MED ORDER — LIDOCAINE HCL 1 % IJ SOLN
5.0000 mL | INTRAMUSCULAR | Status: AC | PRN
  Administered 2023-03-22: 5 mL

## 2023-03-23 MED ORDER — METHYLPREDNISOLONE ACETATE 40 MG/ML IJ SUSP
40.0000 mg | INTRAMUSCULAR | Status: AC | PRN
  Administered 2023-03-22: 40 mg via INTRA_ARTICULAR

## 2023-03-23 MED ORDER — BUPIVACAINE HCL 0.25 % IJ SOLN
4.0000 mL | INTRAMUSCULAR | Status: AC | PRN
  Administered 2023-03-22: 4 mL via INTRA_ARTICULAR

## 2023-03-23 NOTE — Telephone Encounter (Signed)
VOB submitted for Orthovisc, right knee

## 2023-03-30 ENCOUNTER — Telehealth: Payer: Self-pay

## 2023-03-30 ENCOUNTER — Other Ambulatory Visit: Payer: Self-pay

## 2023-03-30 DIAGNOSIS — M1711 Unilateral primary osteoarthritis, right knee: Secondary | ICD-10-CM

## 2023-03-30 NOTE — Telephone Encounter (Signed)
Talked with patient and advised him that his insurance [employer] does not cover gel injections.  Advised about TriVisc, but patient declined.  Stated that he would just do the cortisone injections.  CB# [phone].  Please advise.

## 2023-04-01 NOTE — Telephone Encounter (Signed)
thx

## 2023-04-10 ENCOUNTER — Telehealth: Payer: Self-pay | Admitting: Orthopedic Surgery

## 2023-04-10 ENCOUNTER — Ambulatory Visit: Payer: BC Managed Care – PPO | Admitting: Orthopedic Surgery

## 2023-04-10 DIAGNOSIS — M1711 Unilateral primary osteoarthritis, right knee: Secondary | ICD-10-CM

## 2023-04-10 NOTE — Telephone Encounter (Signed)
Received auth & $25 check for [employer].

## 2023-04-11 NOTE — Progress Notes (Unsigned)
Office Visit Note   Patient: Daniel Caldwell           Date of Birth: 28-Sep-1965           MRN: [id number] Visit Date: 04/10/2023 Requested by: Kathlee Nations, MD [address] [place],  [place] [address] PCP: Kathlee Nations, MD  Subjective: Chief Complaint  Patient presents with   Right Knee - Follow-up    HPI: RITTER SANDEFER is a 57 y.o. male who presents to the office reporting continued right knee pain.  Patient had cortisone injection 03/22/2023.  Insurance does not cover gel injection.  He had slight improvement.  Cortisone injection "does not last long".  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                ROS: All systems reviewed are negative as they relate to the chief complaint within the history of present illness.  Patient denies fevers or chills.  Assessment & Plan: Visit Diagnoses:  1. Arthritis of right knee     Plan: Impression is end-stage arthritis in the right knee.  Left knee is still little stiff following his knee replacement.  I think the patient needs potentially 3 days/month of FMLA for flareups with his right knee.  Cortisone injection not performed today but we did aspirate and inject Toradol.  He will follow-up as needed.  Follow-Up Instructions: No follow-ups on file.   Orders:  No orders of the defined types were placed in this encounter.  No orders of the defined types were placed in this encounter.     Procedures: Large Joint Inj: R knee on 04/10/2023 5:40 PM Indications: diagnostic evaluation, joint swelling and pain Details: 18 G 1.5 in needle, superolateral approach  Arthrogram: No  Medications: 5 mL lidocaine 1 %; 4 mL bupivacaine 0.25 % Outcome: tolerated well, no immediate complications Procedure, treatment alternatives, risks and benefits explained, specific risks discussed. Consent was given by the patient. Immediately prior to procedure a time out was called to verify the correct patient,  procedure, equipment, support staff and site/side marked as required. Patient was prepped and draped in the usual sterile fashion.    Toradol injected   Clinical Data: No additional findings.  Objective: Vital Signs: There were no vitals taken for this visit.  Physical Exam:  Constitutional: Patient appears well-developed HEENT:  Head: Normocephalic Eyes:EOM are normal Neck: Normal range of motion Cardiovascular: Normal rate Pulmonary/chest: Effort normal Neurologic: Patient is alert Skin: Skin is warm Psychiatric: Patient has normal mood and affect  Ortho Exam: Ortho exam demonstrates mild effusion of the right knee with range of motion of 5-1 20.  Extensor mechanism intact.  Alignment intact.  Pedal pulses intact.  Medial and lateral joint line tenderness is present with mild patellofemoral crepitus.  Specialty Comments:  No specialty comments available.  Imaging: No results found.   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   Recurrent right inguinal hernia 01/22/2018   Left inguinal hernia 01/22/2018   Past Medical History:  Diagnosis Date   Left inguinal hernia 01/22/2018   Mixed hyperlipidemia    Pre-diabetes    Recurrent right inguinal hernia 01/22/2018   Sleep apnea    doesn't use CPAP anymore    Family History  Problem Relation Age of Onset   Colon  cancer Neg Hx    Colon polyps Neg Hx    Esophageal cancer Neg Hx    Rectal cancer Neg Hx    Stomach cancer Neg Hx     Past Surgical History:  Procedure Laterality Date   INGUINAL HERNIA REPAIR Bilateral 01/22/2018   Procedure: OPEN BILATERAL INGUINAL HERNIA REPAIR ERAS PATHWAY;  Surgeon: Claud Kelp, MD;  Location: [facility];  Service: General;  Laterality: Bilateral;   INSERTION OF MESH Bilateral 01/22/2018   Procedure: INSERTION OF MESH;  Surgeon:  Claud Kelp, MD;  Location: [facility];  Service: General;  Laterality: Bilateral;   KNEE CLOSED REDUCTION Left 01/20/2022   Procedure: LEFT KNEE MANIPULATION UNDER ANESTHESIA;  Surgeon: Cammy Copa, MD;  Location: [facility] OR;  Service: Orthopedics;  Laterality: Left;   KNEE SURGERY     TOTAL KNEE ARTHROPLASTY Left 12/07/2021   Procedure: LEFT TOTAL KNEE ARTHROPLASTY;  Surgeon: Cammy Copa, MD;  Location: [facility] OR;  Service: Orthopedics;  Laterality: Left;   Social History   Occupational History   Not on file  Tobacco Use   Smoking status: Never   Smokeless tobacco: Former    Types: Chew   Tobacco comments:    Pt states he never chewed tobacco  Vaping Use   Vaping status: Never Used  Substance and Sexual Activity   Alcohol use: Yes    Alcohol/week: 3.0 standard drinks of alcohol    Types: 3 Cans of beer per week   Drug use: No   Sexual activity: Not on file

## 2023-04-13 ENCOUNTER — Encounter: Payer: Self-pay | Admitting: Orthopedic Surgery

## 2023-04-13 MED ORDER — LIDOCAINE HCL 1 % IJ SOLN
5.0000 mL | INTRAMUSCULAR | Status: AC | PRN
  Administered 2023-04-10: 5 mL

## 2023-04-13 MED ORDER — BUPIVACAINE HCL 0.25 % IJ SOLN
4.0000 mL | INTRAMUSCULAR | Status: AC | PRN
  Administered 2023-04-10: 4 mL via INTRA_ARTICULAR

## 2023-06-12 ENCOUNTER — Ambulatory Visit: Payer: BC Managed Care – PPO | Admitting: Orthopedic Surgery

## 2023-06-14 ENCOUNTER — Ambulatory Visit: Payer: BC Managed Care – PPO | Admitting: Orthopedic Surgery

## 2023-06-14 ENCOUNTER — Encounter: Payer: Self-pay | Admitting: Orthopedic Surgery

## 2023-06-14 DIAGNOSIS — M1711 Unilateral primary osteoarthritis, right knee: Secondary | ICD-10-CM

## 2023-06-14 NOTE — Progress Notes (Unsigned)
Office Visit Note   Patient: Daniel Caldwell           Date of Birth: 05-05-1966           MRN: [id number] Visit Date: 06/14/2023 Requested by: Kathlee Nations, MD [address] [place],  [place] [address] PCP: Kathlee Nations, MD  Subjective: Chief Complaint  Patient presents with   Right Knee - Follow-up    HPI: Daniel Caldwell is a 57 y.o. male who presents to the office reporting right knee pain.  Patient is doing about the same.  Had aspiration and injection with Toradol 04/10/2023.  That was helpful for about a week.  Last cortisone injection was 03/22/2023.  Takes Celebrex occasionally.  Still working at the [employer] which is very vigorous work..                ROS: All systems reviewed are negative as they relate to the chief complaint within the history of present illness.  Patient denies fevers or chills.  Assessment & Plan: Visit Diagnoses:  1. Arthritis of right knee     Plan: Impression is right knee arthritis.  Left knee remains relatively stiff but he is functional with it.  Plan is aspiration and injection today with cortisone.  Come back in 6 weeks for possible aspiration of effusion.  Follow-Up Instructions: No follow-ups on file.   Orders:  No orders of the defined types were placed in this encounter.  No orders of the defined types were placed in this encounter.     Procedures: Large Joint Inj: R knee on 06/14/2023 11:10 PM Indications: diagnostic evaluation, joint swelling and pain Details: 18 G 1.5 in needle, superolateral approach  Arthrogram: No  Medications: 5 mL lidocaine 1 % Outcome: tolerated well, no immediate complications Procedure, treatment alternatives, risks and benefits explained, specific risks discussed. Consent was given by the patient. Immediately prior to procedure a time out was called to verify the correct patient, procedure, equipment, support staff and site/side marked as required. Patient was prepped and draped in the usual  sterile fashion.       Clinical Data: No additional findings.  Objective: Vital Signs: There were no vitals taken for this visit.  Physical Exam:  Constitutional: Patient appears well-developed HEENT:  Head: Normocephalic Eyes:EOM are normal Neck: Normal range of motion Cardiovascular: Normal rate Pulmonary/chest: Effort normal Neurologic: Patient is alert Skin: Skin is warm Psychiatric: Patient has normal mood and affect  Ortho Exam: Ortho exam demonstrates slightly antalgic gait to the right.  Moderate effusion is present.  Extensor mechanism intact.  Collateral and cruciate ligaments are stable.  No other masses lymphadenopathy or skin changes noted in that right knee region.  Specialty Comments:  No specialty comments available.  Imaging: No results found.   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   Recurrent right inguinal hernia 01/22/2018   Left inguinal hernia 01/22/2018   Past Medical History:  Diagnosis Date   Left inguinal hernia 01/22/2018   Mixed hyperlipidemia    Pre-diabetes    Recurrent right inguinal hernia 01/22/2018   Sleep apnea    doesn't use CPAP anymore    Family History  Problem Relation Age of Onset   Colon cancer Neg Hx    Colon polyps Neg Hx    Esophageal cancer Neg Hx  Rectal cancer Neg Hx    Stomach cancer Neg Hx     Past Surgical History:  Procedure Laterality Date   INGUINAL HERNIA REPAIR Bilateral 01/22/2018   Procedure: OPEN BILATERAL INGUINAL HERNIA REPAIR ERAS PATHWAY;  Surgeon: Claud Kelp, MD;  Location: [facility];  Service: General;  Laterality: Bilateral;   INSERTION OF MESH Bilateral 01/22/2018   Procedure: INSERTION OF MESH;  Surgeon: Claud Kelp, MD;  Location: [facility];  Service: General;  Laterality: Bilateral;   KNEE CLOSED REDUCTION  Left 01/20/2022   Procedure: LEFT KNEE MANIPULATION UNDER ANESTHESIA;  Surgeon: Cammy Copa, MD;  Location: [facility] OR;  Service: Orthopedics;  Laterality: Left;   KNEE SURGERY     TOTAL KNEE ARTHROPLASTY Left 12/07/2021   Procedure: LEFT TOTAL KNEE ARTHROPLASTY;  Surgeon: Cammy Copa, MD;  Location: [facility] OR;  Service: Orthopedics;  Laterality: Left;   Social History   Occupational History   Not on file  Tobacco Use   Smoking status: Never   Smokeless tobacco: Former    Types: Chew   Tobacco comments:    Pt states he never chewed tobacco  Vaping Use   Vaping status: Never Used  Substance and Sexual Activity   Alcohol use: Yes    Alcohol/week: 3.0 standard drinks of alcohol    Types: 3 Cans of beer per week   Drug use: No   Sexual activity: Not on file

## 2023-06-15 MED ORDER — LIDOCAINE HCL 1 % IJ SOLN
5.0000 mL | INTRAMUSCULAR | Status: AC | PRN
  Administered 2023-06-14: 5 mL

## 2023-07-26 ENCOUNTER — Encounter: Payer: Self-pay | Admitting: Orthopedic Surgery

## 2023-07-26 ENCOUNTER — Ambulatory Visit: Payer: BC Managed Care – PPO | Admitting: Orthopedic Surgery

## 2023-07-26 DIAGNOSIS — M1711 Unilateral primary osteoarthritis, right knee: Secondary | ICD-10-CM

## 2023-07-26 MED ORDER — LIDOCAINE HCL 1 % IJ SOLN
5.0000 mL | INTRAMUSCULAR | Status: AC | PRN
  Administered 2023-07-26: 5 mL

## 2023-07-26 NOTE — Progress Notes (Signed)
Office Visit Note   Patient: Daniel Caldwell           Date of Birth: September 06, 1965           MRN: [id number] Visit Date: 07/26/2023 Requested by: Kathlee Nations, MD [address] [place],  [place] [address] PCP: Kathlee Nations, MD  Subjective: Chief Complaint  Patient presents with   Right Knee - Follow-up    06/14/23 RT KNEE INJECTION    HPI: Daniel Caldwell is a 58 y.o. male who presents to the office reporting Daniel Caldwell is a patient with right knee arthritis.  Had right knee cortisone injection performed 1124.  Toradol injection in October was not helpful.  Reports continued pain.  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  Reports global pain in the right knee.  Also reports continued left knee stiffness following total knee replacement..                ROS: All systems reviewed are negative as they relate to the chief complaint within the history of present illness.  Patient denies fevers or chills.  Assessment & Plan: Visit Diagnoses:  1. Arthritis of right knee     Plan: Impression is right knee arthritis with effusion aspiration performed today.  Talked about open scar excision for the left knee.  Plan for repeat aspiration/injection in 6 weeks.  Follow-Up Instructions: No follow-ups on file.   Orders:  No orders of the defined types were placed in this encounter.  No orders of the defined types were placed in this encounter.     Procedures: Large Joint Inj: R knee on 07/26/2023 7:40 PM Indications: diagnostic evaluation, joint swelling and pain Details: 18 G 1.5 in needle, superolateral approach  Arthrogram: No  Medications: 5 mL lidocaine 1 % Outcome: tolerated well, no immediate complications Procedure, treatment alternatives, risks and benefits explained, specific risks discussed. Consent was given by the patient. Immediately prior to procedure a time out was called to verify the correct patient, procedure, equipment, support staff and site/side marked as required. Patient  was prepped and draped in the usual sterile fashion.       Clinical Data: No additional findings.  Objective: Vital Signs: There were no vitals taken for this visit.  Physical Exam:  Constitutional: Patient appears well-developed HEENT:  Head: Normocephalic Eyes:EOM are normal Neck: Normal range of motion Cardiovascular: Normal rate Pulmonary/chest: Effort normal Neurologic: Patient is alert Skin: Skin is warm Psychiatric: Patient has normal mood and affect  Ortho Exam: Ortho exam demonstrates left knee range of motion of 0-50 of flexion.  No effusion in the left knee but patella mobility is decreased right knee demonstrates moderate effusion about 5 degree flexion contracture and flexion to around 115.  Collateral cruciate ligaments are stable.  Specialty Comments:  No specialty comments available.  Imaging: No results found.   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   Recurrent right inguinal hernia 01/22/2018   Left inguinal hernia 01/22/2018   Past Medical History:  Diagnosis Date   Left inguinal hernia 01/22/2018   Mixed hyperlipidemia    Pre-diabetes    Recurrent right inguinal hernia 01/22/2018   Sleep apnea    doesn't use CPAP anymore    Family History  Problem Relation Age of Onset   Colon cancer Neg Hx    Colon polyps Neg Hx  Esophageal cancer Neg Hx    Rectal cancer Neg Hx    Stomach cancer Neg Hx     Past Surgical History:  Procedure Laterality Date   INGUINAL HERNIA REPAIR Bilateral 01/22/2018   Procedure: OPEN BILATERAL INGUINAL HERNIA REPAIR ERAS PATHWAY;  Surgeon: Claud Kelp, MD;  Location: [facility];  Service: General;  Laterality: Bilateral;   INSERTION OF MESH Bilateral 01/22/2018   Procedure: INSERTION OF MESH;  Surgeon: Claud Kelp, MD;  Location: [facility];   Service: General;  Laterality: Bilateral;   KNEE CLOSED REDUCTION Left 01/20/2022   Procedure: LEFT KNEE MANIPULATION UNDER ANESTHESIA;  Surgeon: Cammy Copa, MD;  Location: [facility] OR;  Service: Orthopedics;  Laterality: Left;   KNEE SURGERY     TOTAL KNEE ARTHROPLASTY Left 12/07/2021   Procedure: LEFT TOTAL KNEE ARTHROPLASTY;  Surgeon: Cammy Copa, MD;  Location: [facility] OR;  Service: Orthopedics;  Laterality: Left;   Social History   Occupational History   Not on file  Tobacco Use   Smoking status: Never   Smokeless tobacco: Former    Types: Chew   Tobacco comments:    Pt states he never chewed tobacco  Vaping Use   Vaping status: Never Used  Substance and Sexual Activity   Alcohol use: Yes    Alcohol/week: 3.0 standard drinks of alcohol    Types: 3 Cans of beer per week   Drug use: No   Sexual activity: Not on file

## 2023-09-06 ENCOUNTER — Ambulatory Visit: Payer: BC Managed Care – PPO | Admitting: Orthopedic Surgery

## 2023-09-06 DIAGNOSIS — M1711 Unilateral primary osteoarthritis, right knee: Secondary | ICD-10-CM

## 2023-09-07 ENCOUNTER — Encounter: Payer: Self-pay | Admitting: Orthopedic Surgery

## 2023-09-07 NOTE — Progress Notes (Signed)
   Procedure Note  Patient: Daniel Caldwell             Date of Birth: 1965/08/31           MRN: [id number]             Visit Date: 09/06/2023  Procedures: Visit Diagnoses:  1. Arthritis of right knee     Large Joint Inj: R knee on 09/06/2023 10:55 AM Indications: diagnostic evaluation, joint swelling and pain Details: 18 G 1.5 in needle, superolateral approach  Arthrogram: No  Medications: 5 mL lidocaine 1 %; 40 mg methylPREDNISolone acetate 40 MG/ML; 4 mL bupivacaine 0.25 % Outcome: tolerated well, no immediate complications Procedure, treatment alternatives, risks and benefits explained, specific risks discussed. Consent was given by the patient. Immediately prior to procedure a time out was called to verify the correct patient, procedure, equipment, support staff and site/side marked as required. Patient was prepped and draped in the usual sterile fashion.    [name] is a patient with right knee arthritis.  Gets episodic aspirations and injection.  Last aspiration injection was 1211.  Last aspiration only was 1 20-25.  Aspiration injection performed today.  We talked a little bit about left knee open scar tissue removal to achieve more range of motion in that knee.  He is going to consider that and we will see him back in 3 months for clinical recheck on the right knee.

## 2023-09-10 ENCOUNTER — Encounter: Payer: Self-pay | Admitting: Orthopedic Surgery

## 2023-09-10 MED ORDER — LIDOCAINE HCL 1 % IJ SOLN
5.0000 mL | INTRAMUSCULAR | Status: AC | PRN
  Administered 2023-09-06: 5 mL

## 2023-09-10 MED ORDER — BUPIVACAINE HCL 0.25 % IJ SOLN
4.0000 mL | INTRAMUSCULAR | Status: AC | PRN
  Administered 2023-09-06: 4 mL via INTRA_ARTICULAR

## 2023-09-10 MED ORDER — METHYLPREDNISOLONE ACETATE 40 MG/ML IJ SUSP
40.0000 mg | INTRAMUSCULAR | Status: AC | PRN
  Administered 2023-09-06: 40 mg via INTRA_ARTICULAR

## 2023-10-18 ENCOUNTER — Ambulatory Visit: Admitting: Orthopedic Surgery

## 2024-02-22 ENCOUNTER — Ambulatory Visit: Admitting: Orthopedic Surgery

## 2024-02-23 ENCOUNTER — Encounter: Payer: Self-pay | Admitting: Orthopedic Surgery

## 2024-02-23 ENCOUNTER — Ambulatory Visit: Admitting: Orthopedic Surgery

## 2024-02-23 DIAGNOSIS — M1711 Unilateral primary osteoarthritis, right knee: Secondary | ICD-10-CM

## 2024-02-23 MED ORDER — BUPIVACAINE HCL 0.25 % IJ SOLN
4.0000 mL | INTRAMUSCULAR | Status: AC | PRN
  Administered 2024-02-23: 4 mL via INTRA_ARTICULAR

## 2024-02-23 MED ORDER — TRIAMCINOLONE ACETONIDE 40 MG/ML IJ SUSP
40.0000 mg | INTRAMUSCULAR | Status: AC | PRN
  Administered 2024-02-23: 40 mg via INTRA_ARTICULAR

## 2024-02-23 MED ORDER — LIDOCAINE HCL 1 % IJ SOLN
5.0000 mL | INTRAMUSCULAR | Status: AC | PRN
  Administered 2024-02-23: 5 mL

## 2024-02-23 NOTE — Progress Notes (Signed)
 Office Visit Note   Patient: Daniel Caldwell           Date of Birth: 1965-07-23           MRN: [id number] Visit Date: 02/23/2024 Requested by: Graydon Mt, MD [address] [place],  [place] [address] PCP: Graydon Mt, MD  Subjective: Chief Complaint  Patient presents with   Right Knee - Pain    HPI: NIEL PERETTI is a 58 y.o. male who presents to the office reporting right knee pain.  Has known history of right knee arthritis.  Last injection done 09/06/2023.  Gel shots is not helping.  He is currently staying with his father who has dementia.  The dementia is getting worse.  He is working 8-hour shifts now 6 days a week.  Works at [employer].  Physical work..                ROS: All systems reviewed are negative as they relate to the chief complaint within the history of present illness.  Patient denies fevers or chills.  Assessment & Plan: Visit Diagnoses:  1. Arthritis of right knee     Plan: Impression is right knee pain with worsening range of motion is worsening arthritis.  We aspirated the knee today.  Will know the right side.  We will see him back in 3 to 4 months to consider repeat intervention.  Left knee remains stiff after knee replacement.  It is functional for him but he is not bending well.  If he were to consider right knee replacement the rehabilitation would have much more aggressive than it was for the first knee replacement.  He will be out of work the next 48 hours secondary to the right knee procedure performed today.  Follow-Up Instructions: No follow-ups on file.   Orders:  No orders of the defined types were placed in this encounter.  No orders of the defined types were placed in this encounter.     Procedures: Large Joint Inj: R knee on 02/23/2024 3:53 PM Indications: diagnostic evaluation, joint swelling and pain Details: 18 G 1.5 in needle, superolateral approach  Arthrogram: No  Medications: 5 mL lidocaine  1 %; 4 mL bupivacaine  0.25 %; 40  mg triamcinolone  acetonide 40 MG/ML Outcome: tolerated well, no immediate complications Procedure, treatment alternatives, risks and benefits explained, specific risks discussed. Consent was given by the patient. Immediately prior to procedure a time out was called to verify the correct patient, procedure, equipment, support staff and site/side marked as required. Patient was prepped and draped in the usual sterile fashion.       Clinical Data: No additional findings.  Objective: Vital Signs: There were no vitals taken for this visit.  Physical Exam:  Constitutional: Patient appears well-developed HEENT:  Head: Normocephalic Eyes:EOM are normal Neck: Normal range of motion Cardiovascular: Normal rate Pulmonary/chest: Effort normal Neurologic: Patient is alert Skin: Skin is warm Psychiatric: Patient has normal mood and affect  Ortho Exam: Ortho exam demonstrates mild effusion of the right knee.  Range of motion is 20-90.  Extensor mechanism intact.  Pedal pulses palpable.  No groin pain with internal/external Tatian of the leg.  Specialty Comments:  No specialty comments available.  Imaging: No results found.   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   Recurrent right inguinal hernia 01/22/2018  Left inguinal hernia 01/22/2018   Past Medical History:  Diagnosis Date   Left inguinal hernia 01/22/2018   Mixed hyperlipidemia    Pre-diabetes    Recurrent right inguinal hernia 01/22/2018   Sleep apnea    doesn't use CPAP anymore    Family History  Problem Relation Age of Onset   Colon cancer Neg Hx    Colon polyps Neg Hx    Esophageal cancer Neg Hx    Rectal cancer Neg Hx    Stomach cancer Neg Hx     Past Surgical History:  Procedure Laterality Date   INGUINAL HERNIA REPAIR Bilateral 01/22/2018   Procedure: OPEN BILATERAL  INGUINAL HERNIA REPAIR ERAS PATHWAY;  Surgeon: Gail Favorite, MD;  Location: [facility];  Service: General;  Laterality: Bilateral;   INSERTION OF MESH Bilateral 01/22/2018   Procedure: INSERTION OF MESH;  Surgeon: Gail Favorite, MD;  Location: [facility];  Service: General;  Laterality: Bilateral;   KNEE CLOSED REDUCTION Left 01/20/2022   Procedure: LEFT KNEE MANIPULATION UNDER ANESTHESIA;  Surgeon: Addie Cordella Hamilton, MD;  Location: [facility] OR;  Service: Orthopedics;  Laterality: Left;   KNEE SURGERY     TOTAL KNEE ARTHROPLASTY Left 12/07/2021   Procedure: LEFT TOTAL KNEE ARTHROPLASTY;  Surgeon: Addie Cordella Hamilton, MD;  Location: [facility] OR;  Service: Orthopedics;  Laterality: Left;   Social History   Occupational History   Not on file  Tobacco Use   Smoking status: Never   Smokeless tobacco: Former    Types: Chew   Tobacco comments:    Pt states he never chewed tobacco  Vaping Use   Vaping status: Never Used  Substance and Sexual Activity   Alcohol use: Yes    Alcohol/week: 3.0 standard drinks of alcohol    Types: 3 Cans of beer per week   Drug use: No   Sexual activity: Not on file

## 2024-03-25 ENCOUNTER — Telehealth: Payer: Self-pay | Admitting: Orthopedic Surgery

## 2024-03-25 NOTE — Telephone Encounter (Signed)
 On 09/12/23, [employer] completed [employer] forms for intermittent leave. Received new form request for extension. Last seen 02/23/24, next appt 05/29/24. Please advise. If okay, advise the amount of episodes per month and the duration. Thank you!

## 2024-03-26 NOTE — Telephone Encounter (Signed)
 yes as per last note

## 2024-03-27 NOTE — Telephone Encounter (Signed)
 IC patient, advised Dr. Addie approved the extension for intermittent FMLA. Advised pt there is a $20.00 form fee. We already have auth for [employer]. Patient aware payment is pre-pay. Once received, forms will be complete.

## 2024-04-15 ENCOUNTER — Telehealth: Payer: Self-pay | Admitting: Orthopedic Surgery

## 2024-04-15 NOTE — Telephone Encounter (Signed)
 Received vm from patient stating [employer] has not received [employer] forms for FMLA intermittent. It was faxed 09/25. IC,lmvm advising pt that was faxed 9/25 and refaxed just now [phone]

## 2024-05-06 ENCOUNTER — Encounter: Payer: Self-pay | Admitting: Radiology

## 2024-05-29 ENCOUNTER — Ambulatory Visit: Admitting: Orthopedic Surgery

## 2024-06-21 ENCOUNTER — Encounter: Payer: Self-pay | Admitting: Orthopedic Surgery

## 2024-06-21 ENCOUNTER — Ambulatory Visit: Admitting: Orthopedic Surgery

## 2024-06-21 DIAGNOSIS — M1711 Unilateral primary osteoarthritis, right knee: Secondary | ICD-10-CM | POA: Diagnosis not present

## 2024-06-21 NOTE — Progress Notes (Unsigned)
 "  Office Visit Note   Patient: Daniel Caldwell           Date of Birth: 02/23/1966           MRN: [id number] Visit Date: 06/21/2024 Requested by: Graydon Mt, MD [id number] [place],  [place] [id number] PCP: Graydon Mt, MD  Subjective: Chief Complaint  Patient presents with   Right Knee - Pain    HPI: Daniel Caldwell is a 58 y.o. male who presents to the office reporting right knee pain.  Has had prior cortisone injections which have helped.  Last 1 02/23/2024.  He has lost some weight since his last clinic visit.  He is working..                ROS: All systems reviewed are negative as they relate to the chief complaint within the history of present illness.  Patient denies fevers or chills.  Assessment & Plan: Visit Diagnoses:  1. Arthritis of right knee     Plan: Impression is right knee arthritis with overall maintenance of range of motion but persistent and moderate effusion.  Right knee hurts on the medicine much as the left.  Aspiration injection performed today.  Follow-up as needed.  Follow-Up Instructions: No follow-ups on file.   Orders:  No orders of the defined types were placed in this encounter.  No orders of the defined types were placed in this encounter.     Procedures: Large Joint Inj: L knee on 06/21/2024 10:32 AM Indications: diagnostic evaluation, joint swelling and pain Details: 18 G 1.5 in needle, superolateral approach  Arthrogram: No  Medications: 5 mL lidocaine  1 %; 4 mL bupivacaine  0.25 %; 40 mg triamcinolone  acetonide 40 MG/ML Outcome: tolerated well, no immediate complications Procedure, treatment alternatives, risks and benefits explained, specific risks discussed. Consent was given by the patient. Immediately prior to procedure a time out was called to verify the correct patient, procedure, equipment, support staff and site/side marked as required. Patient was prepped and draped in the usual sterile fashion.       Clinical  Data: No additional findings.  Objective: Vital Signs: There were no vitals taken for this visit.  Physical Exam:  Constitutional: Patient appears well-developed HEENT:  Head: Normocephalic Eyes:EOM are normal Neck: Normal range of motion Cardiovascular: Normal rate Pulmonary/chest: Effort normal Neurologic: Patient is alert Skin: Skin is warm Psychiatric: Patient has normal mood and affect  Ortho Exam: Ortho exam demonstrates range of motion on the right of 5-1 10.  Moderate effusion present.  Collateral crucial ligaments are stable.  Pedal pulses palpable  Specialty Comments:  No specialty comments available.  Imaging: No results found.   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   Recurrent right inguinal hernia 01/22/2018   Left inguinal hernia 01/22/2018   Past Medical History:  Diagnosis Date   Left inguinal hernia 01/22/2018   Mixed hyperlipidemia    Pre-diabetes    Recurrent right inguinal hernia 01/22/2018   Sleep apnea    doesn't use CPAP anymore    Family History  Problem Relation Age of Onset   Colon cancer Neg Hx    Colon polyps Neg Hx    Esophageal cancer Neg Hx    Rectal cancer Neg Hx    Stomach cancer Neg Hx     Past Surgical  History:  Procedure Laterality Date   INGUINAL HERNIA REPAIR Bilateral 01/22/2018   Procedure: OPEN BILATERAL INGUINAL HERNIA REPAIR ERAS PATHWAY;  Surgeon: Gail Favorite, MD;  Location: [facility];  Service: General;  Laterality: Bilateral;   INSERTION OF MESH Bilateral 01/22/2018   Procedure: INSERTION OF MESH;  Surgeon: Gail Favorite, MD;  Location: [facility];  Service: General;  Laterality: Bilateral;   KNEE CLOSED REDUCTION Left 01/20/2022   Procedure: LEFT KNEE MANIPULATION UNDER ANESTHESIA;  Surgeon: Addie Cordella Hamilton, MD;  Location[facility];   Service: Orthopedics;  Laterality: Left;   KNEE SURGERY     TOTAL KNEE ARTHROPLASTY Left 12/07/2021   Procedure: LEFT TOTAL KNEE ARTHROPLASTY;  Surgeon: Addie Cordella Hamilton, MD;  Location: [facility] OR;  Service: Orthopedics;  Laterality: Left;   Social History   Occupational History   Not on file  Tobacco Use   Smoking status: Never   Smokeless tobacco: Former    Types: Chew   Tobacco comments:    Pt states he never chewed tobacco  Vaping Use   Vaping status: Never Used  Substance and Sexual Activity   Alcohol use: Yes    Alcohol/week: 3.0 standard drinks of alcohol    Types: 3 Cans of beer per week   Drug use: No   Sexual activity: Not on file        "

## 2024-06-22 MED ORDER — TRIAMCINOLONE ACETONIDE 40 MG/ML IJ SUSP
40.0000 mg | INTRAMUSCULAR | Status: AC | PRN
  Administered 2024-06-21: 40 mg via INTRA_ARTICULAR

## 2024-06-22 MED ORDER — LIDOCAINE HCL 1 % IJ SOLN
5.0000 mL | INTRAMUSCULAR | Status: AC | PRN
  Administered 2024-06-21: 5 mL

## 2024-06-22 MED ORDER — BUPIVACAINE HCL 0.25 % IJ SOLN
4.0000 mL | INTRAMUSCULAR | Status: AC | PRN
  Administered 2024-06-21: 4 mL via INTRA_ARTICULAR

## 2024-09-20 ENCOUNTER — Ambulatory Visit: Admitting: Orthopedic Surgery
